# Patient Record
Sex: Male | Born: 1980 | Race: Black or African American | Hispanic: No | Smoking: Former smoker
Health system: Southern US, Community
[De-identification: ages and names within clinical notes are randomized; demographics above are authoritative.]

## PROBLEM LIST (undated history)

## (undated) DIAGNOSIS — D869 Sarcoidosis, unspecified: Secondary | ICD-10-CM

## (undated) HISTORY — PX: LYMPH NODE BIOPSY: SHX201

---

## 2004-06-06 ENCOUNTER — Encounter: Admission: RE | Admit: 2004-06-06 | Discharge: 2004-06-06 | Payer: Self-pay | Admitting: Family Medicine

## 2004-06-21 ENCOUNTER — Encounter (INDEPENDENT_AMBULATORY_CARE_PROVIDER_SITE_OTHER): Payer: Self-pay | Admitting: Specialist

## 2004-06-21 ENCOUNTER — Encounter (INDEPENDENT_AMBULATORY_CARE_PROVIDER_SITE_OTHER): Payer: Self-pay | Admitting: Surgery

## 2004-06-21 ENCOUNTER — Ambulatory Visit (HOSPITAL_BASED_OUTPATIENT_CLINIC_OR_DEPARTMENT_OTHER): Admission: RE | Admit: 2004-06-21 | Discharge: 2004-06-21 | Payer: Self-pay | Admitting: Surgery

## 2004-06-21 ENCOUNTER — Ambulatory Visit (HOSPITAL_COMMUNITY): Admission: RE | Admit: 2004-06-21 | Discharge: 2004-06-21 | Payer: Self-pay | Admitting: Surgery

## 2004-07-27 ENCOUNTER — Encounter: Admission: RE | Admit: 2004-07-27 | Discharge: 2004-07-27 | Payer: Self-pay | Admitting: Family Medicine

## 2004-07-31 ENCOUNTER — Ambulatory Visit: Payer: Self-pay | Admitting: Internal Medicine

## 2004-09-25 ENCOUNTER — Ambulatory Visit: Payer: Self-pay | Admitting: Internal Medicine

## 2004-10-11 ENCOUNTER — Ambulatory Visit: Payer: Self-pay | Admitting: Internal Medicine

## 2004-10-13 ENCOUNTER — Encounter: Admission: RE | Admit: 2004-10-13 | Discharge: 2004-10-13 | Payer: Self-pay | Admitting: Family Medicine

## 2004-10-30 ENCOUNTER — Ambulatory Visit: Payer: Self-pay | Admitting: Internal Medicine

## 2004-10-31 ENCOUNTER — Ambulatory Visit: Payer: Self-pay | Admitting: *Deleted

## 2004-11-07 ENCOUNTER — Ambulatory Visit: Payer: Self-pay | Admitting: Internal Medicine

## 2004-11-22 ENCOUNTER — Ambulatory Visit: Payer: Self-pay | Admitting: Internal Medicine

## 2005-01-05 ENCOUNTER — Ambulatory Visit: Payer: Self-pay | Admitting: Family Medicine

## 2005-01-29 ENCOUNTER — Ambulatory Visit: Payer: Self-pay | Admitting: Family Medicine

## 2009-05-20 ENCOUNTER — Emergency Department (HOSPITAL_COMMUNITY): Admission: EM | Admit: 2009-05-20 | Discharge: 2009-05-20 | Payer: Self-pay | Admitting: Emergency Medicine

## 2010-02-18 ENCOUNTER — Emergency Department (HOSPITAL_COMMUNITY): Admission: EM | Admit: 2010-02-18 | Discharge: 2010-02-18 | Payer: Self-pay | Admitting: Emergency Medicine

## 2010-06-11 ENCOUNTER — Encounter: Payer: Self-pay | Admitting: Family Medicine

## 2010-10-06 NOTE — Op Note (Signed)
NAME:  SKYELER, SMOLA NO.:  [id number]   MEDICAL RECORD NO.:  [id number]          PATIENT TYPE:  AMB   LOCATION:  [facility]                         FACILITY:  [facility]   PHYSICIAN:  Currie Paris, M.D.DATE OF BIRTH:  1981/02/21   DATE OF PROCEDURE:  06/21/2004  DATE OF DISCHARGE:                                 OPERATIVE REPORT   OFFICE MEDICAL RECORD NUMBER [id number].   PREOPERATIVE DIAGNOSIS:  Lymphadenopathy.   POSTOPERATIVE DIAGNOSIS:  Lymphadenopathy.   OPERATION:  Excisional biopsy right posterior cervical lymph node.   SURGEON:  Currie Paris, M.D.   ANESTHESIA:  MAC.   CLINICAL HISTORY:  This patient is a 30 year old who has presented with some  adenopathy.  Chest x-ray did also confirm some mediastinal adenopathy, and  we elected to do a biopsy of a cervical node, thinking that that would be  the easiest and likely to give [place] a diagnosis.   DESCRIPTION OF PROCEDURE:  The patient was seen in the holding area, and he  has no further questions.  The node in question was identified by the  patient and myself and marked.   The patient was taken to the operating room and given IV sedation.  The neck  was prepped and draped.  Time out was performed.   The patient's area over the node was anesthetized with 1% Xylocaine mixed  equally with 0.25% plain Marcaine.  A short incision was made and small  bleeders coagulated.  What appeared to be little platysmal fibers were split  and a lymph node identified, measuring 1.2 cm.  It was carefully dissected  out, staying very close to the lymph node using blunt dissection and a small  amount of cautery on a couple of small points that oozed.  I stayed close to  this to avoid injury to any nerves or other structures.   Once this was out, I did not really feel any other readily accessible nodes,  and I believed I had adequate tissue for pathology.   The incision was closed with a single 3-0 Vicryl to reapproximate  the  platysma and 4-0 Monocryl subcuticular for the Dermabond.   The patient tolerated the procedure well.  There were no operative  complications.  All counts were correct.      CJS/MEDQ  D:  06/21/2004  T:  06/21/2004  Job:  [id number]   cc:   Quita Skye. Artis Flock, M.D.  [address], [address]  [place]  [place] [address]  Fax: [phone]

## 2010-11-17 ENCOUNTER — Emergency Department (HOSPITAL_COMMUNITY)
Admission: EM | Admit: 2010-11-17 | Discharge: 2010-11-17 | Disposition: A | Discharge status: Home / Self Care | Payer: Self-pay | Attending: Emergency Medicine | Admitting: Emergency Medicine

## 2010-11-17 DIAGNOSIS — N390 Urinary tract infection, site not specified: Secondary | ICD-10-CM | POA: Insufficient documentation

## 2010-11-17 DIAGNOSIS — D869 Sarcoidosis, unspecified: Secondary | ICD-10-CM | POA: Insufficient documentation

## 2010-11-17 DIAGNOSIS — R319 Hematuria, unspecified: Secondary | ICD-10-CM | POA: Insufficient documentation

## 2010-11-17 LAB — URINALYSIS, ROUTINE W REFLEX MICROSCOPIC
Ketones, ur: NEGATIVE mg/dL
Leukocytes, UA: NEGATIVE
Nitrite: NEGATIVE
Protein, ur: NEGATIVE mg/dL
pH: 5.5 (ref 5.0–8.0)

## 2010-11-17 LAB — URINE MICROSCOPIC-ADD ON

## 2010-11-18 LAB — URINE CULTURE
Colony Count: NO GROWTH
Culture: NO GROWTH

## 2011-04-03 ENCOUNTER — Emergency Department (HOSPITAL_COMMUNITY)
Admission: EM | Admit: 2011-04-03 | Discharge: 2011-04-03 | Disposition: A | Discharge status: Home / Self Care | Payer: PRIVATE HEALTH INSURANCE | Attending: Emergency Medicine | Admitting: Emergency Medicine

## 2011-04-03 ENCOUNTER — Encounter: Payer: Self-pay | Admitting: *Deleted

## 2011-04-03 DIAGNOSIS — R110 Nausea: Secondary | ICD-10-CM | POA: Insufficient documentation

## 2011-04-03 DIAGNOSIS — J329 Chronic sinusitis, unspecified: Secondary | ICD-10-CM | POA: Insufficient documentation

## 2011-04-03 DIAGNOSIS — J3489 Other specified disorders of nose and nasal sinuses: Secondary | ICD-10-CM | POA: Insufficient documentation

## 2011-04-03 DIAGNOSIS — R51 Headache: Secondary | ICD-10-CM | POA: Insufficient documentation

## 2011-04-03 HISTORY — DX: Sarcoidosis, unspecified: D86.9

## 2011-04-03 MED ORDER — AZITHROMYCIN 250 MG PO TABS: 250.0000 mg | ORAL_TABLET | Freq: Every day | ORAL | Status: AC

## 2011-04-03 MED ORDER — KETOROLAC TROMETHAMINE 60 MG/2ML IM SOLN
60.0000 mg | Freq: Once | INTRAMUSCULAR | Status: AC
  Administered 2011-04-03: 60 mg via INTRAMUSCULAR
  Filled 2011-04-03: qty 2

## 2011-04-03 MED ORDER — ONDANSETRON 4 MG PO TBDP
8.0000 mg | ORAL_TABLET | Freq: Once | ORAL | Status: AC
  Administered 2011-04-03: 8 mg via ORAL
  Filled 2011-04-03: qty 2

## 2011-04-03 MED ORDER — PROMETHAZINE HCL 25 MG PO TABS: 25.0000 mg | ORAL_TABLET | Freq: Four times a day (QID) | ORAL | Status: AC | PRN

## 2011-04-03 NOTE — ED Provider Notes (Signed)
Medical screening examination/treatment/procedure(s) were performed by non-physician practitioner and as supervising physician I was immediately available for consultation/collaboration  Harrold Donath R. Rubin Payor, MD 04/03/11 1851

## 2011-04-03 NOTE — ED Notes (Signed)
PT COMPLAINS OF NASAL STUFFINESS AND ONSET HEADACHE ACROSS HIS BROW THIS MORNING WHILE AT WORK. STATES HE VOMITED 1 TIME AFTER ONSET HEADACHE. PERL. STATES HAS HX OF SINUS INFECTION 2 MONTHS AGO THAT WAS TREATED WITH ZPACK

## 2011-04-03 NOTE — ED Provider Notes (Signed)
History     CSN: [id number] Arrival date & time: 04/03/2011 12:57 PM   First MD Initiated Contact with Patient 04/03/11 1327      Chief Complaint  Patient presents with  . Sinusitis   Patient is a 30 y.o. male presenting with sinusitis and headaches.  Sinusitis  Associated symptoms include congestion and sinus pressure. Pertinent negatives include no sweats, no hoarse voice, no swollen glands, no cough and no shortness of breath.  Headache  The pain is located in the frontal region. Associated symptoms include nausea. Pertinent negatives include no anorexia, no fever, no malaise/fatigue, no near-syncope, no orthopnea, no shortness of breath and no vomiting.   Patient seen and evaluated at 2:00 pm. Patient reports that he has had some nasal congestion and sinus pressure is still a left maxillary sinus. Patient reports also having associated headache started this afternoon was gradual in onset. Patient denies any fevers. Reports that he has recently been treated for a sinus infection. Was started on Z-Pak. Reports that this feels alliances previous episode of sinusitis. Denies any neuro focal deficits. Denies any double vision. Denies any problems swallowing or walking. Denies any difficulty talking. Denies any numbness or weakness. Denies that this is the worst headache of his life.  Past Medical History  Diagnosis Date  . Sarcoidosis     History reviewed. No pertinent past surgical history.  History reviewed. No pertinent family history.  History  Substance Use Topics  . Smoking status: Not on file  . Smokeless tobacco: Not on file  . Alcohol Use: No      Review of Systems  Constitutional: Negative for fever and malaise/fatigue.  HENT: Positive for congestion and sinus pressure. Negative for hoarse voice.   Respiratory: Negative for cough and shortness of breath.   Cardiovascular: Negative for orthopnea and near-syncope.  Gastrointestinal: Positive for nausea. Negative for  vomiting and anorexia.  Neurological: Positive for headaches.    Allergies  Review of patient's allergies indicates no known allergies.  Home Medications  No current outpatient prescriptions on file.  BP 125/109  Pulse 65  Temp(Src) 97.8 F (36.6 C) (Oral)  Resp 16  SpO2 97%  Physical Exam  Nursing note and vitals reviewed. Constitutional: He is oriented to person, place, and time. He appears well-developed and well-nourished.  HENT:  Head: Normocephalic and atraumatic.  Right Ear: External ear normal.  Left Ear: External ear normal.  Nose: Right sinus exhibits frontal sinus tenderness. Left sinus exhibits maxillary sinus tenderness and frontal sinus tenderness.  Mouth/Throat: Oropharynx is clear and moist. No oropharyngeal exudate.  Eyes: Pupils are equal, round, and reactive to light. Right eye exhibits no nystagmus. Left eye exhibits no nystagmus.    Neck: Normal range of motion. Neck supple. No rigidity. No Brudzinski's sign and no Kernig's sign noted.  Cardiovascular: Normal rate and regular rhythm.  Exam reveals no gallop and no friction rub.   No murmur heard. Pulmonary/Chest: Effort normal and breath sounds normal. No respiratory distress. He has no wheezes.  Musculoskeletal: Normal range of motion. He exhibits no tenderness.  Neurological: He is alert and oriented to person, place, and time. He has normal reflexes. He displays normal reflexes. No cranial nerve deficit or sensory deficit. He exhibits normal muscle tone. Coordination and gait normal. GCS eye subscore is 4. GCS verbal subscore is 5. GCS motor subscore is 6.       Normal gait. rhomberg negative.     ED Course  Procedures (including critical care time)  Patient seen and evaluated.  VSS reviewed. Nursing notes reviewed no neurofocal deficits, no imaging needed at this time. Will monitor the patient closely. They agree with the treatment plan and diagnosis.   2:26 PM Patient seen and re-evaluated. Resting  comfortably. VSS stable. NAD. Patient notified of testing results. Stated agreement and understanding. Patient stated understanding to treatment plan and diagnosis. Discussed with patient warning signs to return. Advised patient extensively on treatment plan. Pain medication given in the ED.     MDM  Sinusitis Headache         Demetrius Charity, PA 04/03/11 1427  Demetrius Charity, PA 04/03/11 1429  Demetrius Charity, PA 04/03/11 1429

## 2011-04-03 NOTE — ED Notes (Signed)
To ed for eval after having some blurred vision associated with a HA. States he is having pain under eyes. States he is blowing his nose and has recently been on z-pack for sinus infection. Appears in nad

## 2011-05-14 ENCOUNTER — Encounter (HOSPITAL_COMMUNITY): Payer: Self-pay

## 2011-05-14 ENCOUNTER — Emergency Department (HOSPITAL_COMMUNITY)
Admission: EM | Admit: 2011-05-14 | Discharge: 2011-05-14 | Disposition: A | Discharge status: Home / Self Care | Payer: PRIVATE HEALTH INSURANCE | Attending: Emergency Medicine | Admitting: Emergency Medicine

## 2011-05-14 DIAGNOSIS — K029 Dental caries, unspecified: Secondary | ICD-10-CM | POA: Insufficient documentation

## 2011-05-14 DIAGNOSIS — R6883 Chills (without fever): Secondary | ICD-10-CM | POA: Insufficient documentation

## 2011-05-14 DIAGNOSIS — R51 Headache: Secondary | ICD-10-CM | POA: Insufficient documentation

## 2011-05-14 DIAGNOSIS — J329 Chronic sinusitis, unspecified: Secondary | ICD-10-CM | POA: Insufficient documentation

## 2011-05-14 MED ORDER — SODIUM CHLORIDE 0.65 % NA SOLN: 1.0000 | NASAL | Status: DC | PRN

## 2011-05-14 MED ORDER — PSEUDOEPHEDRINE HCL 30 MG PO TABS: 30.0000 mg | ORAL_TABLET | ORAL | Status: AC | PRN

## 2011-05-14 NOTE — ED Provider Notes (Signed)
Medical screening examination/treatment/procedure(s) were performed by non-physician practitioner and as supervising physician I was immediately available for consultation/collaboration.   Dayton Bailiff, MD 05/14/11 [phone]

## 2011-05-14 NOTE — ED Provider Notes (Signed)
History     CSN: [id number]  Arrival date & time 05/14/11  1546   First MD Initiated Contact with Patient 05/14/11 1604      Chief Complaint  Patient presents with  . Facial Pain  . Nasal Congestion    x 2 wks    (Consider location/radiation/quality/duration/timing/severity/associated sxs/prior treatment) Patient is a 30 y.o. male presenting with sinusitis. The history is provided by the patient.  Sinusitis  This is a new problem. There has been no fever. The fever has been present for less than 1 day. The pain is at a severity of 3/10. The pain has been constant since onset. Associated symptoms include chills, sweats, congestion and sinus pressure. Pertinent negatives include no ear pain, no sore throat, no cough and no shortness of breath.  Pt repots sinus pressure, nasal congestion for the last several days. Already taking amoxicillin for dental pain. States symptoms not improving. States hx of sinusitis. Denies fever, chills, sore throat, cough, nausea, vomiting.  Past Medical History  Diagnosis Date  . Sarcoidosis     Past Surgical History  Procedure Date  . Lymph node biopsy     No family history on file.  History  Substance Use Topics  . Smoking status: Current Everyday Smoker -- 0.5 packs/day for 2 years    Types: Cigarettes  . Smokeless tobacco: Not on file  . Alcohol Use: Yes     occasionally      Review of Systems  Constitutional: Positive for chills. Negative for fever.  HENT: Positive for congestion, rhinorrhea and sinus pressure. Negative for ear pain, sore throat, neck pain and ear discharge.   Eyes: Negative.   Respiratory: Negative for cough and shortness of breath.   Cardiovascular: Negative.   Gastrointestinal: Negative.   Genitourinary: Negative.   Musculoskeletal: Negative.   Neurological: Negative.   Psychiatric/Behavioral: Negative.     Allergies  Review of patient's allergies indicates no known allergies.  Home Medications    Current Outpatient Rx  Name Route Sig Dispense Refill  . AMOXICILLIN 500 MG PO CAPS Oral Take 500 mg by mouth 4 (four) times daily. PT TO TAKE FOR 7 DAYS PTS ON DAY 5 OF THERAPY      BP 106/52  Pulse 71  Temp(Src) 98.8 F (37.1 C) (Oral)  Resp 18  SpO2 100%  Physical Exam  Nursing note and vitals reviewed. Constitutional: He is oriented to person, place, and time. He appears well-developed and well-nourished. No distress.  HENT:  Head: Normocephalic.  Right Ear: Tympanic membrane, external ear and ear canal normal.  Left Ear: Tympanic membrane, external ear and ear canal normal.  Nose: Rhinorrhea present.  Mouth/Throat: Uvula is midline, oropharynx is clear and moist and mucous membranes are normal. Dental caries present. No dental abscesses or uvula swelling.  Eyes: Conjunctivae are normal.  Neck: Neck supple.  Cardiovascular: Normal rate, regular rhythm and normal heart sounds.   Pulmonary/Chest: Effort normal and breath sounds normal.  Abdominal: Soft. Bowel sounds are normal. There is no tenderness.  Musculoskeletal: Normal range of motion.  Lymphadenopathy:    He has no cervical adenopathy.  Neurological: He is alert and oriented to person, place, and time.  Skin: Skin is warm and dry. No rash noted.  Psychiatric: He has a normal mood and affect.    ED Course  Procedures (including critical care time)  Pt with sinus pressure, nasal congestion. No fever, vs normal. Pt already taking amoxicillin. Suspect viral sinusitis since no improvement on antibiotics.  Will start on sudafed, nasal saline, follow up.   MDM          Lottie Mussel, PA 05/14/11 1650

## 2011-07-04 ENCOUNTER — Encounter (HOSPITAL_COMMUNITY): Payer: Self-pay | Admitting: *Deleted

## 2011-07-04 ENCOUNTER — Emergency Department (HOSPITAL_COMMUNITY)
Admission: EM | Admit: 2011-07-04 | Discharge: 2011-07-04 | Disposition: A | Discharge status: Home / Self Care | Payer: BC Managed Care – PPO | Source: Home / Self Care | Attending: Family Medicine | Admitting: Family Medicine

## 2011-07-04 DIAGNOSIS — J019 Acute sinusitis, unspecified: Secondary | ICD-10-CM

## 2011-07-04 MED ORDER — DOXYCYCLINE HYCLATE 100 MG PO CAPS: 100.0000 mg | ORAL_CAPSULE | Freq: Two times a day (BID) | ORAL | Status: AC

## 2011-07-04 MED ORDER — IPRATROPIUM BROMIDE 0.06 % NA SOLN: 2.0000 | Freq: Four times a day (QID) | NASAL | Status: DC

## 2011-07-04 MED ORDER — GUAIFENESIN ER 600 MG PO TB12: 1200.0000 mg | ORAL_TABLET | Freq: Two times a day (BID) | ORAL | Status: DC

## 2011-07-04 MED ORDER — ONDANSETRON HCL 4 MG PO TABS: 4.0000 mg | ORAL_TABLET | Freq: Four times a day (QID) | ORAL | Status: AC

## 2011-07-04 NOTE — ED Notes (Signed)
Pt with c/o sinus congestion x 2 weeks - vomited x one today

## 2011-07-04 NOTE — ED Provider Notes (Signed)
History     CSN: [id number]  Arrival date & time 07/04/11  1452   First MD Initiated Contact with Patient 07/04/11 1608      Chief Complaint  Patient presents with  . Nasal Congestion    (Consider location/radiation/quality/duration/timing/severity/associated sxs/prior treatment) Patient is a 31 y.o. male presenting with URI. The history is provided by the patient.  URI The primary symptoms include fever, sore throat, cough and vomiting. Primary symptoms do not include wheezing. The current episode started more than 1 week ago. This is a new problem. The problem has been gradually worsening.  Symptoms associated with the illness include congestion and rhinorrhea. Associated symptoms comments: Green mucous for 2 weeks..    Past Medical History  Diagnosis Date  . Sarcoidosis     Past Surgical History  Procedure Date  . Lymph node biopsy     History reviewed. No pertinent family history.  History  Substance Use Topics  . Smoking status: Current Everyday Smoker -- 0.5 packs/day for 2 years    Types: Cigarettes  . Smokeless tobacco: Not on file  . Alcohol Use: Yes     occasionally      Review of Systems  Constitutional: Positive for fever.  HENT: Positive for congestion, sore throat, rhinorrhea and postnasal drip.   Respiratory: Positive for cough. Negative for wheezing.   Gastrointestinal: Positive for vomiting.  Genitourinary: Negative.   Musculoskeletal: Negative.     Allergies  Review of patient's allergies indicates no known allergies.  Home Medications   Current Outpatient Rx  Name Route Sig Dispense Refill  . IBUPROFEN 400 MG PO TABS Oral Take 400 mg by mouth every 6 (six) hours as needed.    . AMOXICILLIN 500 MG PO CAPS Oral Take 500 mg by mouth 4 (four) times daily. PT TO TAKE FOR 7 DAYS PTS ON DAY 5 OF THERAPY    . DOXYCYCLINE HYCLATE 100 MG PO CAPS Oral Take 1 capsule (100 mg total) by mouth 2 (two) times daily. 28 capsule 0  . GUAIFENESIN ER 600  MG PO TB12 Oral Take 2 tablets (1,200 mg total) by mouth 2 (two) times daily. 30 tablet 1  . IPRATROPIUM BROMIDE 0.06 % NA SOLN Nasal Place 2 sprays into the nose 4 (four) times daily. 15 mL 1  . ONDANSETRON HCL 4 MG PO TABS Oral Take 1 tablet (4 mg total) by mouth every 6 (six) hours. 8 tablet 0  . SODIUM CHLORIDE 0.65 % NA SOLN Nasal Place 1 spray into the nose as needed for congestion. 15 mL 12    BP 120/79  Pulse 68  Temp(Src) 98.5 F (36.9 C) (Oral)  Resp 16  SpO2 100%  Physical Exam  Nursing note and vitals reviewed. Constitutional: He is oriented to person, place, and time. He appears well-developed and well-nourished.  HENT:  Head: Normocephalic.  Right Ear: External ear normal.  Left Ear: External ear normal.  Nose: Nose normal.  Mouth/Throat: Oropharynx is clear and moist.  Eyes: Pupils are equal, round, and reactive to light.  Neck: Normal range of motion. Neck supple.  Cardiovascular: Normal rate, regular rhythm, normal heart sounds and intact distal pulses.   Pulmonary/Chest: Effort normal and breath sounds normal.  Lymphadenopathy:    He has no cervical adenopathy.  Neurological: He is alert and oriented to person, place, and time.  Skin: Skin is warm and dry.    ED Course  Procedures (including critical care time)  Labs Reviewed - No data to display  No results found.   1. Sinusitis, acute       MDM          Barkley Bruns, MD 07/04/11 [phone]

## 2011-07-04 NOTE — Discharge Instructions (Signed)
Drink plenty of fluids as discussed, use medicine as prescribed,  Return or see your doctor if further problems °

## 2011-07-27 ENCOUNTER — Other Ambulatory Visit: Payer: Self-pay | Admitting: Rheumatology

## 2011-07-27 ENCOUNTER — Ambulatory Visit
Admission: RE | Admit: 2011-07-27 | Discharge: 2011-07-27 | Disposition: A | Discharge status: Home / Self Care | Payer: BC Managed Care – PPO | Source: Ambulatory Visit | Attending: Rheumatology | Admitting: Rheumatology

## 2011-07-27 DIAGNOSIS — D869 Sarcoidosis, unspecified: Secondary | ICD-10-CM

## 2011-08-07 ENCOUNTER — Emergency Department (INDEPENDENT_AMBULATORY_CARE_PROVIDER_SITE_OTHER)
Admission: EM | Admit: 2011-08-07 | Discharge: 2011-08-07 | Disposition: A | Discharge status: Home / Self Care | Payer: BC Managed Care – PPO | Source: Home / Self Care | Attending: Family Medicine | Admitting: Family Medicine

## 2011-08-07 ENCOUNTER — Encounter (HOSPITAL_COMMUNITY): Payer: Self-pay | Admitting: Emergency Medicine

## 2011-08-07 DIAGNOSIS — A5900 Urogenital trichomoniasis, unspecified: Secondary | ICD-10-CM

## 2011-08-07 MED ORDER — METRONIDAZOLE 500 MG PO TABS: 500.0000 mg | ORAL_TABLET | Freq: Two times a day (BID) | ORAL | Status: AC

## 2011-08-07 NOTE — Discharge Instructions (Signed)
Trichomoniasis  Trichomoniasis is an infection, caused by the Trichomonas organism, that affects both women and men. In women, the outer male genitalia and the vagina are affected. In men, the penis is mainly affected, but the prostate and other reproductive organs can also be involved. Trichomoniasis is a sexually transmitted disease (STD) and is most often passed to another person through sexual contact. The majority of people who get trichomoniasis do so from a sexual encounter and are also at risk for other STDs.  CAUSES    Sexual intercourse with an infected partner.   It can be present in swimming pools or hot tubs.  SYMPTOMS    Abnormal gray-green frothy vaginal discharge in women.   Vaginal itching and irritation in women.   Itching and irritation of the area outside the vagina in women.   Penile discharge with or without pain in males.   Inflammation of the urethra (urethritis), causing painful urination.   Bleeding after sexual intercourse.  RELATED COMPLICATIONS   Pelvic inflammatory disease.   Infection of the uterus (endometritis).   Infertility.   Tubal (ectopic) pregnancy.   It can be associated with other STDs, including gonorrhea and chlamydia, hepatitis B, and HIV.  COMPLICATIONS DURING PREGNANCY   Early (premature) delivery.   Premature rupture of the membranes (PROM).   Low birth weight.  DIAGNOSIS    Visualization of Trichomonas under the microscope from the vagina discharge.   Ph of the vagina greater than 4.5, tested with a test tape.   Trich Rapid Test.   Culture of the organism, but this is not usually needed.   It may be found on a Pap test.   Having a "strawberry cervix,"which means the cervix looks very red like a strawberry.  TREATMENT    You may be given medication to fight the infection. Inform your caregiver if you could be or are pregnant. Some medications used to treat the infection should not be taken during pregnancy.   Over-the-counter medications or  creams to decrease itching or irritation may be recommended.   Your sexual partner will need to be treated if infected.  HOME CARE INSTRUCTIONS    Take all medication prescribed by your caregiver.   Take over-the-counter medication for itching or irritation as directed by your caregiver.   Do not have sexual intercourse while you have the infection.   Do not douche or wear tampons.   Discuss your infection with your partner, as your partner may have acquired the infection from you. Or, your partner may have been the person who transmitted the infection to you.   Have your sex partner examined and treated if necessary.   Practice safe, informed, and protected sex.   See your caregiver for other STD testing.  SEEK MEDICAL CARE IF:    You still have symptoms after you finish the medication.   You have an oral temperature above 102 F (38.9 C).   You develop belly (abdominal) pain.   You have pain when you urinate.   You have bleeding after sexual intercourse.   You develop a rash.   The medication makes you sick or makes you throw up (vomit).  Document Released: 10/31/2000 Document Revised: 04/26/2011 Document Reviewed: 11/26/2008  [employer] Patient Information 2012 [employer].

## 2011-08-07 NOTE — ED Provider Notes (Signed)
History     CSN: [id number]  Arrival date & time 08/07/11  1713   First MD Initiated Contact with Patient 08/07/11 1721      Chief Complaint  Patient presents with  . SEXUALLY TRANSMITTED DISEASE    (Consider location/radiation/quality/duration/timing/severity/associated sxs/prior treatment) HPI Comments: 31 year old male with history of sarcoidosis. Here concerned as his sexual partner was recently diagnosed with trichomonas and has a notification card. He states she has not had a GYN test or Pap smear in the last 3 years. None of them have had significant symptoms. Patient states he had blood in the urine about 5 months ago and his urine test did not show trichomonas.  He wonders about timing of infection and it is possible that he could have had trichomonas in the last 3 years. He reports being monogamous. Patient is  not interested in being checked for other STDs.  His girlfriend was negative for other STDs. He wants to be treated for trichomonas.   Past Medical History  Diagnosis Date  . Sarcoidosis     Past Surgical History  Procedure Date  . Lymph node biopsy     History reviewed. No pertinent family history.  History  Substance Use Topics  . Smoking status: Current Everyday Smoker -- 0.5 packs/day for 2 years    Types: Cigarettes  . Smokeless tobacco: Not on file  . Alcohol Use: 0.6 oz/week    1 Cans of beer per week     occasionally      Review of Systems  Genitourinary: Negative for dysuria, hematuria, flank pain, discharge, scrotal swelling, genital sores and testicular pain.  All other systems reviewed and are negative.    Allergies  Review of patient's allergies indicates no known allergies.  Home Medications   Current Outpatient Rx  Name Route Sig Dispense Refill  . HYDROXYCHLOROQUINE SULFATE 200 MG PO TABS Oral Take 600 mg by mouth daily.    Marland Kitchen PREDNISONE 10 MG PO TABS Oral Take 10 mg by mouth 3 (three) times daily.    . AMOXICILLIN 500 MG PO  CAPS Oral Take 500 mg by mouth 4 (four) times daily. PT TO TAKE FOR 7 DAYS PTS ON DAY 5 OF THERAPY    . GUAIFENESIN ER 600 MG PO TB12 Oral Take 2 tablets (1,200 mg total) by mouth 2 (two) times daily. 30 tablet 1  . IBUPROFEN 400 MG PO TABS Oral Take 400 mg by mouth every 6 (six) hours as needed.    . IPRATROPIUM BROMIDE 0.06 % NA SOLN Nasal Place 2 sprays into the nose 4 (four) times daily. 15 mL 1  . METRONIDAZOLE 500 MG PO TABS Oral Take 1 tablet (500 mg total) by mouth 2 (two) times daily. 14 tablet 0  . SODIUM CHLORIDE 0.65 % NA SOLN Nasal Place 1 spray into the nose as needed for congestion. 15 mL 12    BP 129/79  Pulse 61  Temp(Src) 98.4 F (36.9 C) (Oral)  Resp 16  Ht 6\' 1"  (1.854 m)  Wt 215 lb (97.523 kg)  BMI 28.37 kg/m2  SpO2 100%  Physical Exam  Nursing note and vitals reviewed. Constitutional: He is oriented to person, place, and time. He appears well-developed and well-nourished. No distress.  Cardiovascular: Normal heart sounds.   Pulmonary/Chest: Breath sounds normal.  Abdominal: Soft. There is no tenderness.       No CVT  Neurological: He is alert and oriented to person, place, and time.    ED Course  Procedures (including critical care time)  Labs Reviewed - No data to display No results found.   1. Trichomoniasis, urogenital       MDM  Explained it is possible the couple could of been infected with trichomonas for a long time without significant symptoms. Prescribed metronidazole 500 mg twice a day for 7 days. Patient aware that inconsistent condom use become prevent sexually transmitted diseases. The client declined STD testing today.        Sharin Grave, MD 08/10/11 [phone]

## 2011-08-07 NOTE — ED Notes (Signed)
Pt came in with a card notifying him that his sexual partner has been diagnosed with trichomoniasis. Pt stated that he had blood in the urine 02/2011 and was seen by a physician, but wasn't diagnosed with anything. Pt currently denies discharge and dysfunction with urination.

## 2012-02-16 ENCOUNTER — Emergency Department (HOSPITAL_COMMUNITY)
Admission: EM | Admit: 2012-02-16 | Discharge: 2012-02-16 | Disposition: A | Discharge status: Home / Self Care | Payer: BC Managed Care – PPO | Source: Home / Self Care | Attending: Family Medicine | Admitting: Family Medicine

## 2012-02-16 ENCOUNTER — Encounter (HOSPITAL_COMMUNITY): Payer: Self-pay

## 2012-02-16 DIAGNOSIS — J069 Acute upper respiratory infection, unspecified: Secondary | ICD-10-CM

## 2012-02-16 DIAGNOSIS — R05 Cough: Secondary | ICD-10-CM

## 2012-02-16 LAB — POCT RAPID STREP A: Streptococcus, Group A Screen (Direct): NEGATIVE

## 2012-02-16 MED ORDER — AZITHROMYCIN 250 MG PO TABS: ORAL_TABLET | ORAL | Status: DC

## 2012-02-16 MED ORDER — ALBUTEROL SULFATE HFA 108 (90 BASE) MCG/ACT IN AERS: 2.0000 | INHALATION_SPRAY | RESPIRATORY_TRACT | Status: DC | PRN

## 2012-02-16 MED ORDER — GUAIFENESIN-CODEINE 100-10 MG/5ML PO SYRP: 5.0000 mL | ORAL_SOLUTION | Freq: Three times a day (TID) | ORAL | Status: DC | PRN

## 2012-02-16 MED ORDER — SODIUM CHLORIDE 0.65 % NA SOLN: 1.0000 | NASAL | Status: DC | PRN

## 2012-02-16 MED ORDER — CETIRIZINE-PSEUDOEPHEDRINE ER 5-120 MG PO TB12: 1.0000 | ORAL_TABLET | Freq: Two times a day (BID) | ORAL | Status: DC

## 2012-02-16 NOTE — ED Notes (Signed)
Cough for 1 week, reports yellow nasal secretions; chest clear to ascultation

## 2012-02-16 NOTE — ED Provider Notes (Signed)
Medical screening examination/treatment/procedure(s) were performed by resident physician or non-physician practitioner and as supervising physician I was immediately available for consultation/collaboration.   Wilkins Elpers DOUGLAS MD.    Jveon Pound D Ladonne Sharples, MD 02/16/12 1527 

## 2012-02-16 NOTE — ED Provider Notes (Signed)
History     CSN: [id number]  Arrival date & time 02/16/12  5284   None     Chief Complaint  Patient presents with  . Cough    (Consider location/radiation/quality/duration/timing/severity/associated sxs/prior treatment) Patient is a 31 y.o. male presenting with cough. The history is provided by the patient.  Cough This is a new problem.  Nathan Flores is a 31 y.o. male who complains of onset of head cold for 1 week.  Pt took left over amoxicillin for symptoms with no relief, additionally took sinus relief tabs with no relief in symptoms.   + sore throat + cough, non productive + pleuritic pain No wheezing + nasal congestion + post-nasal drainage + sinus pain/pressure + voice changes No chest congestion No itchy/red eyes No earache No hemoptysis No SOB + chills/sweats No fever No nausea No vomiting No abdominal pain No diarrhea No skin rashes No fatigue No myalgias No headache  No ill contacts   Past Medical History  Diagnosis Date  . Sarcoidosis     Past Surgical History  Procedure Date  . Lymph node biopsy     History reviewed. No pertinent family history.  History  Substance Use Topics  . Smoking status: Current Every Day Smoker -- 0.5 packs/day for 2 years    Types: Cigarettes  . Smokeless tobacco: Not on file  . Alcohol Use: 0.6 oz/week    1 Cans of beer per week     occasionally      Review of Systems  Respiratory: Positive for cough.   All other systems reviewed and are negative.    Allergies  Review of patient's allergies indicates no known allergies.  Home Medications   Current Outpatient Rx  Name Route Sig Dispense Refill  . HYDROXYCHLOROQUINE SULFATE 200 MG PO TABS Oral Take 600 mg by mouth daily.    . IBUPROFEN 400 MG PO TABS Oral Take 400 mg by mouth every 6 (six) hours as needed.    . ALBUTEROL SULFATE HFA 108 (90 BASE) MCG/ACT IN AERS Inhalation Inhale 2 puffs into the lungs every 4 (four) hours as needed for wheezing. 1  Inhaler 0  . AMOXICILLIN 500 MG PO CAPS Oral Take 500 mg by mouth 4 (four) times daily. PT TO TAKE FOR 7 DAYS PTS ON DAY 5 OF THERAPY    . AZITHROMYCIN 250 MG PO TABS  Azithromycin 500mg  on day 1, then 250mg  on days 2-4 6 tablet 0  . CETIRIZINE-PSEUDOEPHEDRINE ER 5-120 MG PO TB12 Oral Take 1 tablet by mouth 2 (two) times daily. 60 tablet 0  . GUAIFENESIN-CODEINE 100-10 MG/5ML PO SYRP Oral Take 5 mLs by mouth 3 (three) times daily as needed for cough. 120 mL 0  . IPRATROPIUM BROMIDE 0.06 % NA SOLN Nasal Place 2 sprays into the nose 4 (four) times daily. 15 mL 1  . PREDNISONE 10 MG PO TABS Oral Take 10 mg by mouth 3 (three) times daily.    . SODIUM CHLORIDE 0.65 % NA SOLN Nasal Place 1 spray into the nose as needed for congestion. 15 mL 12    BP 118/51  Pulse 67  Temp 98.1 F (36.7 C) (Oral)  Resp 18  SpO2 96%  Physical Exam  Nursing note and vitals reviewed. Constitutional: He is oriented to person, place, and time. Vital signs are normal. He appears well-developed and well-nourished. He is active and cooperative.  HENT:  Head: Normocephalic.  Right Ear: Hearing, tympanic membrane, external ear and ear canal normal.  Left  Ear: Hearing, tympanic membrane, external ear and ear canal normal.  Nose: Rhinorrhea present. Right sinus exhibits maxillary sinus tenderness. Right sinus exhibits no frontal sinus tenderness. Left sinus exhibits maxillary sinus tenderness. Left sinus exhibits no frontal sinus tenderness.  Mouth/Throat: Uvula is midline and mucous membranes are normal. Posterior oropharyngeal erythema present. No oropharyngeal exudate or posterior oropharyngeal edema.  Eyes: Conjunctivae normal are normal. Pupils are equal, round, and reactive to light. No scleral icterus.  Neck: Trachea normal. Neck supple. No spinous process tenderness and no muscular tenderness present.  Cardiovascular: Normal rate, regular rhythm, S1 normal, normal heart sounds, intact distal pulses and normal  pulses.   Pulmonary/Chest: Effort normal and breath sounds normal.  Lymphadenopathy:       Head (right side): No submental, no submandibular, no tonsillar, no preauricular, no posterior auricular and no occipital adenopathy present.       Head (left side): No submental, no submandibular, no tonsillar, no preauricular, no posterior auricular and no occipital adenopathy present.    He has no cervical adenopathy.  Neurological: He is alert and oriented to person, place, and time. No cranial nerve deficit or sensory deficit.  Skin: Skin is warm and dry.  Psychiatric: He has a normal mood and affect. His speech is normal and behavior is normal. Judgment and thought content normal. Cognition and memory are normal.    ED Course  Procedures (including critical care time)   Labs Reviewed  POCT RAPID STREP A ([facility] ONLY)   No results found.   1. Cough   2. URI (upper respiratory infection)       MDM  Increase fluid intake, rest.  Begin azithromycin if symptoms are not improved in 3-5 days.  Begin saline nasal spray and/or saline irrigation, and cough suppressant at bedtime. Antihistamines of your choice (Claritin or Zyrtec).  Tylenol or Motrin for fever/discomfort.  Followup with PCP if not improving 7 to 10 days.        Johnsie Kindred, NP 02/16/12 1037

## 2012-04-22 ENCOUNTER — Encounter (HOSPITAL_COMMUNITY): Payer: Self-pay | Admitting: Emergency Medicine

## 2012-04-22 ENCOUNTER — Emergency Department (INDEPENDENT_AMBULATORY_CARE_PROVIDER_SITE_OTHER)
Admission: EM | Admit: 2012-04-22 | Discharge: 2012-04-22 | Disposition: A | Discharge status: Home / Self Care | Payer: BC Managed Care – PPO | Source: Home / Self Care | Attending: Emergency Medicine | Admitting: Emergency Medicine

## 2012-04-22 ENCOUNTER — Emergency Department (HOSPITAL_COMMUNITY)
Admit: 2012-04-22 | Discharge: 2012-04-22 | Disposition: A | Discharge status: Home / Self Care | Payer: BC Managed Care – PPO | Attending: Emergency Medicine | Admitting: Emergency Medicine

## 2012-04-22 DIAGNOSIS — R109 Unspecified abdominal pain: Secondary | ICD-10-CM

## 2012-04-22 DIAGNOSIS — R197 Diarrhea, unspecified: Secondary | ICD-10-CM

## 2012-04-22 LAB — POCT URINALYSIS DIP (DEVICE)
Bilirubin Urine: NEGATIVE
Glucose, UA: NEGATIVE mg/dL
Nitrite: NEGATIVE
Specific Gravity, Urine: 1.025 (ref 1.005–1.030)
Urobilinogen, UA: 0.2 mg/dL (ref 0.0–1.0)

## 2012-04-22 LAB — POCT H PYLORI SCREEN: H. PYLORI SCREEN, POC: NEGATIVE

## 2012-04-22 NOTE — ED Notes (Signed)
Instructed to put on gown for physician examination 

## 2012-04-22 NOTE — ED Notes (Signed)
Discharge is pending urine results

## 2012-04-22 NOTE — ED Provider Notes (Signed)
Medical screening examination/treatment/procedure(s) were performed by non-physician practitioner and as supervising physician I was immediately available for consultation/collaboration.   Raynald Blend, MD 04/22/12 1556

## 2012-04-22 NOTE — ED Provider Notes (Signed)
History     CSN: [id number]  Arrival date & time 04/22/12  1306   First MD Initiated Contact with Patient 04/22/12 1347      Chief Complaint  Patient presents with  . Abdominal Pain    (Consider location/radiation/quality/duration/timing/severity/associated sxs/prior treatment) Patient is a 31 y.o. male presenting with abdominal pain. The history is provided by the patient.  Abdominal Pain The primary symptoms of the illness include abdominal pain and nausea. The current episode started more than 2 days ago (1 week). The onset of the illness was sudden. The problem has not changed since onset. The pain came on gradually. The abdominal pain is generalized. The abdominal pain does not radiate. The severity of the abdominal pain is 4/10. The abdominal pain is relieved by passing flatus, bowel movement and vomiting. The abdominal pain is exacerbated by eating.  The nausea is exacerbated by food.  The patient has had a change in bowel habit. Additional symptoms associated with the illness include constipation. Symptoms associated with the illness do not include anorexia, heartburn, urgency, hematuria, frequency or back pain. Significant associated medical issues do not include PUD, GERD or diverticulitis.    Past Medical History  Diagnosis Date  . Sarcoidosis     Past Surgical History  Procedure Date  . Lymph node biopsy     No family history on file.  History  Substance Use Topics  . Smoking status: Current Every Day Smoker -- 0.5 packs/day for 2 years    Types: Cigarettes  . Smokeless tobacco: Not on file  . Alcohol Use: 0.6 oz/week    1 Cans of beer per week     Comment: occasionally      Review of Systems  Gastrointestinal: Positive for nausea, abdominal pain and constipation. Negative for heartburn and anorexia.  Genitourinary: Negative for urgency, frequency and hematuria.  Musculoskeletal: Negative for back pain.  All other systems reviewed and are  negative.    Allergies  Review of patient's allergies indicates no known allergies.  Home Medications   Current Outpatient Rx  Name  Route  Sig  Dispense  Refill  . HYDROXYCHLOROQUINE SULFATE 200 MG PO TABS   Oral   Take 600 mg by mouth daily.         . IBUPROFEN 400 MG PO TABS   Oral   Take 400 mg by mouth every 6 (six) hours as needed.         Marland Kitchen PREDNISONE 10 MG PO TABS   Oral   Take 10 mg by mouth 3 (three) times daily.         . ALBUTEROL SULFATE HFA 108 (90 BASE) MCG/ACT IN AERS   Inhalation   Inhale 2 puffs into the lungs every 4 (four) hours as needed for wheezing.   1 Inhaler   0   . AMOXICILLIN 500 MG PO CAPS   Oral   Take 500 mg by mouth 4 (four) times daily. PT TO TAKE FOR 7 DAYS PTS ON DAY 5 OF THERAPY         . AZITHROMYCIN 250 MG PO TABS      Azithromycin 500mg  on day 1, then 250mg  on days 2-4   6 tablet   0   . CETIRIZINE-PSEUDOEPHEDRINE ER 5-120 MG PO TB12   Oral   Take 1 tablet by mouth 2 (two) times daily.   60 tablet   0   . GUAIFENESIN-CODEINE 100-10 MG/5ML PO SYRP   Oral   Take 5 mLs  by mouth 3 (three) times daily as needed for cough.   120 mL   0   . IPRATROPIUM BROMIDE 0.06 % NA SOLN   Nasal   Place 2 sprays into the nose 4 (four) times daily.   15 mL   1   . SODIUM CHLORIDE 0.65 % NA SOLN   Nasal   Place 1 spray into the nose as needed for congestion.   15 mL   12     BP 131/68  Pulse 72  Temp 97.6 F (36.4 C) (Oral)  Resp 20  SpO2 96%  Physical Exam  Nursing note and vitals reviewed. Constitutional: He is oriented to person, place, and time. Vital signs are normal. He appears well-developed and well-nourished. He is active and cooperative.  HENT:  Head: Normocephalic.  Mouth/Throat: Oropharynx is clear and moist. No oropharyngeal exudate.  Eyes: Conjunctivae normal are normal. Pupils are equal, round, and reactive to light. No scleral icterus.  Neck: Trachea normal and normal range of motion. Neck supple.   Cardiovascular: Normal rate, regular rhythm, normal heart sounds and intact distal pulses.   Pulmonary/Chest: Effort normal and breath sounds normal.  Abdominal: Soft. Bowel sounds are normal. There is generalized tenderness. There is no rebound and no guarding.  Lymphadenopathy:    He has no cervical adenopathy.  Neurological: He is alert and oriented to person, place, and time. No cranial nerve deficit or sensory deficit.  Skin: Skin is warm and dry.  Psychiatric: He has a normal mood and affect. His speech is normal and behavior is normal. Judgment and thought content normal. Cognition and memory are normal.    ED Course  Procedures (including critical care time)   Labs Reviewed  POCT H PYLORI SCREEN   Dg Abd 1 View  04/22/2012  *RADIOLOGY REPORT*  Clinical Data: Abdominal pain with diarrhea for 1 week.  ABDOMEN - 1 VIEW  Comparison: Lumbar spine radiographs 05/20/2009.  Findings: The bowel gas pattern is normal.  There is no evidence of bowel wall thickening or suspicious calcification.  There is no supine evidence of free intraperitoneal air.  The osseous structures appear normal.  IMPRESSION: No active abdominal findings.   Original Report Authenticated By: Carey Bullocks, M.D.      1. Abdominal  pain, other specified site   2. Diarrhea       MDM  Clear liquid diet, follow up prn        Johnsie Kindred, NP 04/22/12 [phone]

## 2012-04-22 NOTE — ED Notes (Signed)
Patient transported to X-ray 

## 2012-04-22 NOTE — ED Notes (Signed)
Offered gingerale/sprite-declined by patient

## 2012-04-22 NOTE — ED Notes (Addendum)
Reports stomach virus approx 2 weeks ago involving vomiting and diarrhea.  Treated with imodium.  By the evening, n/v/d/ had stopped.  Patient reports feeling ok/normal for 2-3 days.  Then since thanksgiving day, seems like he has had a change in bowel routine.  Patient reports he is concerned about constipation.  Last bm was 2 days ago.  Denies any constipation history.  Patient reports passing gas without difficulty.  Patient feeling "full" and "bloated".  Has had an intermittent pain in lower abdomen.  Denies urinary symptoms

## 2012-05-14 ENCOUNTER — Emergency Department (HOSPITAL_COMMUNITY)
Admission: EM | Admit: 2012-05-14 | Discharge: 2012-05-14 | Disposition: A | Discharge status: Home / Self Care | Payer: No Typology Code available for payment source | Attending: Emergency Medicine | Admitting: Emergency Medicine

## 2012-05-14 ENCOUNTER — Encounter (HOSPITAL_COMMUNITY): Payer: Self-pay | Admitting: Emergency Medicine

## 2012-05-14 DIAGNOSIS — Y9389 Activity, other specified: Secondary | ICD-10-CM | POA: Insufficient documentation

## 2012-05-14 DIAGNOSIS — S39012A Strain of muscle, fascia and tendon of lower back, initial encounter: Secondary | ICD-10-CM

## 2012-05-14 DIAGNOSIS — F17200 Nicotine dependence, unspecified, uncomplicated: Secondary | ICD-10-CM | POA: Insufficient documentation

## 2012-05-14 DIAGNOSIS — Z79899 Other long term (current) drug therapy: Secondary | ICD-10-CM | POA: Insufficient documentation

## 2012-05-14 DIAGNOSIS — S335XXA Sprain of ligaments of lumbar spine, initial encounter: Secondary | ICD-10-CM | POA: Insufficient documentation

## 2012-05-14 DIAGNOSIS — D869 Sarcoidosis, unspecified: Secondary | ICD-10-CM | POA: Insufficient documentation

## 2012-05-14 MED ORDER — NAPROXEN 500 MG PO TABS: 500.0000 mg | ORAL_TABLET | Freq: Two times a day (BID) | ORAL | Status: DC | PRN

## 2012-05-14 MED ORDER — IBUPROFEN 200 MG PO TABS
600.0000 mg | ORAL_TABLET | Freq: Once | ORAL | Status: AC
  Administered 2012-05-14: 600 mg via ORAL
  Filled 2012-05-14: qty 3

## 2012-05-14 MED ORDER — OXYCODONE-ACETAMINOPHEN 5-325 MG PO TABS: 1.0000 | ORAL_TABLET | ORAL | Status: DC | PRN
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
## 2012-08-11 ENCOUNTER — Emergency Department (HOSPITAL_COMMUNITY): Payer: BC Managed Care – PPO

## 2012-08-11 ENCOUNTER — Encounter (HOSPITAL_COMMUNITY): Payer: Self-pay | Admitting: Emergency Medicine

## 2012-08-11 ENCOUNTER — Emergency Department (HOSPITAL_COMMUNITY)
Admission: EM | Admit: 2012-08-11 | Discharge: 2012-08-11 | Disposition: A | Discharge status: Home / Self Care | Payer: BC Managed Care – PPO | Attending: Emergency Medicine | Admitting: Emergency Medicine

## 2012-08-11 DIAGNOSIS — F17200 Nicotine dependence, unspecified, uncomplicated: Secondary | ICD-10-CM | POA: Insufficient documentation

## 2012-08-11 DIAGNOSIS — T148XXA Other injury of unspecified body region, initial encounter: Secondary | ICD-10-CM

## 2012-08-11 DIAGNOSIS — Y9389 Activity, other specified: Secondary | ICD-10-CM | POA: Insufficient documentation

## 2012-08-11 DIAGNOSIS — S199XXA Unspecified injury of neck, initial encounter: Secondary | ICD-10-CM | POA: Insufficient documentation

## 2012-08-11 DIAGNOSIS — K0889 Other specified disorders of teeth and supporting structures: Secondary | ICD-10-CM

## 2012-08-11 DIAGNOSIS — S0993XA Unspecified injury of face, initial encounter: Secondary | ICD-10-CM | POA: Insufficient documentation

## 2012-08-11 DIAGNOSIS — IMO0002 Reserved for concepts with insufficient information to code with codable children: Secondary | ICD-10-CM | POA: Insufficient documentation

## 2012-08-11 DIAGNOSIS — Z8619 Personal history of other infectious and parasitic diseases: Secondary | ICD-10-CM | POA: Insufficient documentation

## 2012-08-11 DIAGNOSIS — Y92410 Unspecified street and highway as the place of occurrence of the external cause: Secondary | ICD-10-CM | POA: Insufficient documentation

## 2012-08-11 MED ORDER — PENICILLIN V POTASSIUM 500 MG PO TABS: 500.0000 mg | ORAL_TABLET | Freq: Four times a day (QID) | ORAL | Status: AC

## 2012-08-11 MED ORDER — IBUPROFEN 800 MG PO TABS: 800.0000 mg | ORAL_TABLET | Freq: Three times a day (TID) | ORAL | Status: DC

## 2012-08-11 MED ORDER — HYDROCODONE-ACETAMINOPHEN 5-325 MG PO TABS: 2.0000 | ORAL_TABLET | Freq: Four times a day (QID) | ORAL | Status: DC | PRN

## 2012-08-11 MED ORDER — IBUPROFEN 400 MG PO TABS
800.0000 mg | ORAL_TABLET | Freq: Once | ORAL | Status: AC
  Administered 2012-08-11: 800 mg via ORAL
  Filled 2012-08-11: qty 2
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
## 2012-08-12 NOTE — ED Provider Notes (Signed)
Medical screening examination/treatment/procedure(s) were performed by non-physician practitioner and as supervising physician I was immediately available for consultation/collaboration.   Maximo Spratling E Dolores Mcgovern, MD 08/12/12 1540 

## 2012-12-15 ENCOUNTER — Emergency Department (HOSPITAL_COMMUNITY)
Admission: EM | Admit: 2012-12-15 | Discharge: 2012-12-15 | Disposition: A | Discharge status: Home / Self Care | Payer: Self-pay | Attending: Emergency Medicine | Admitting: Emergency Medicine

## 2012-12-15 ENCOUNTER — Encounter (HOSPITAL_COMMUNITY): Payer: Self-pay | Admitting: Emergency Medicine

## 2012-12-15 DIAGNOSIS — R21 Rash and other nonspecific skin eruption: Secondary | ICD-10-CM | POA: Insufficient documentation

## 2012-12-15 DIAGNOSIS — D869 Sarcoidosis, unspecified: Secondary | ICD-10-CM | POA: Insufficient documentation

## 2012-12-15 DIAGNOSIS — F17200 Nicotine dependence, unspecified, uncomplicated: Secondary | ICD-10-CM | POA: Insufficient documentation

## 2012-12-15 MED ORDER — CEPHALEXIN 500 MG PO CAPS: 500.0000 mg | ORAL_CAPSULE | Freq: Four times a day (QID) | ORAL | Status: DC

## 2012-12-15 MED ORDER — PREDNISONE 10 MG PO TABS: 30.0000 mg | ORAL_TABLET | Freq: Every day | ORAL | Status: DC

## 2012-12-15 NOTE — ED Notes (Signed)
Pt has been receiving inj shots and they stopped and he wants his prednisone filled till he can see his doc and wants to see if his skin area is infected. Has appoint with his md in [place] on the 18th of this month.

## 2012-12-15 NOTE — ED Provider Notes (Signed)
CSN: [id number]     Arrival date & time 12/15/12  1321 History    This chart was scribed for Roxy Horseman, non-physician practitioner working with Laray Anger, DO by Leone Payor, ED Scribe. This patient was seen in room WTR9/WTR9 and the patient's care was started at 1321.   First MD Initiated Contact with Patient 12/15/12 1503     Chief Complaint  Patient presents with  . Rash    The history is provided by the patient. No language interpreter was used.    HPI Comments: Nathan Flores. is a 32 y.o. male with past medical history of sarcoidosis who presents to the Emergency Department complaining of a flare up of chronic sarcoidosis. This new episode started 1-2 days ago and is constant, gradually worsening, located to the bilateral arms and face. Pt usually takes prednisone but has run out for a few months. Pt has been seeing a dermatologist locally and was given injections to the face. States his MD is at [facility] but he cannot see him for a couple weeks. Pt is also concerned it may get infected.    Past Medical History  Diagnosis Date  . Sarcoidosis   . Sarcoidosis    Past Surgical History  Procedure Laterality Date  . Lymph node biopsy     No family history on file. History  Substance Use Topics  . Smoking status: Current Every Day Smoker -- 0.50 packs/day for 2 years    Types: Cigarettes  . Smokeless tobacco: Not on file  . Alcohol Use: 0.6 oz/week    1 Cans of beer per week     Comment: occasionally    Review of Systems  Constitutional: Negative for fever.  Skin: Positive for rash.    Allergies  Review of patient's allergies indicates no known allergies.  Home Medications   Current Outpatient Rx  Name  Route  Sig  Dispense  Refill  . ibuprofen (ADVIL,MOTRIN) 200 MG tablet   Oral   Take 400 mg by mouth every 6 (six) hours as needed for pain.          BP 112/78  Pulse 88  Temp(Src) 98 F (36.7 C)  Resp 20  SpO2 95% Physical Exam  Nursing  note and vitals reviewed. Constitutional: He is oriented to person, place, and time. He appears well-developed and well-nourished.  HENT:  Head: Normocephalic and atraumatic.  Eyes: Conjunctivae and EOM are normal. Pupils are equal, round, and reactive to light.  Neck: Normal range of motion. Neck supple.  Cardiovascular: Normal rate, regular rhythm and normal heart sounds.   Pulmonary/Chest: Effort normal and breath sounds normal.  Abdominal: Soft. Bowel sounds are normal.  Musculoskeletal: Normal range of motion.  Neurological: He is alert and oriented to person, place, and time.  Skin: Skin is warm and dry.  Scattered patches of maculopapular rash on the upper extremities and under the nose. Does not appear to be infected.   Psychiatric: He has a normal mood and affect.    ED Course   Procedures (including critical care time)  DIAGNOSTIC STUDIES: Oxygen Saturation is 95% on RA, adequate by my interpretation.    COORDINATION OF CARE: 3:10 PM Discussed treatment plan with pt at bedside and pt agreed to plan.    Labs Reviewed - No data to display No results found. 1. Rash     MDM  Patient with a rash, will treat with prednisone taper. Instructions given to the patient regarding the use of  prednisone. Patient will followup with his rheumatologist. He is stable and ready for discharge. He is concerned about infection of the rash, does not appear infected at this time, but I will give him Keflex as he states that he noticed some discharge a few days ago.  I personally performed the services described in this documentation, which was scribed in my presence. The recorded information has been reviewed and is accurate.    Roxy Horseman, PA-C 12/15/12 1746

## 2012-12-15 NOTE — Progress Notes (Signed)
[facility] provided patient with a list of primary care resources. Patient stated that he was pending [employer].

## 2012-12-15 NOTE — Progress Notes (Signed)
Patient reports he does have a pcp, it's a woma and he can't think of her name.  He reports that she is a doctor with [facility] physicians.

## 2012-12-17 NOTE — ED Provider Notes (Signed)
Medical screening examination/treatment/procedure(s) were performed by non-physician practitioner and as supervising physician I was immediately available for consultation/collaboration.   Aayushi Solorzano M Leeona Mccardle, DO 12/17/12 1622 

## 2012-12-28 ENCOUNTER — Telehealth (HOSPITAL_COMMUNITY): Payer: Self-pay | Admitting: Emergency Medicine

## 2013-03-20 ENCOUNTER — Emergency Department (HOSPITAL_COMMUNITY)
Admission: EM | Admit: 2013-03-20 | Discharge: 2013-03-21 | Disposition: A | Discharge status: Home / Self Care | Payer: BC Managed Care – PPO | Attending: Emergency Medicine | Admitting: Emergency Medicine

## 2013-03-20 ENCOUNTER — Encounter (HOSPITAL_COMMUNITY): Payer: Self-pay | Admitting: Emergency Medicine

## 2013-03-20 DIAGNOSIS — R112 Nausea with vomiting, unspecified: Secondary | ICD-10-CM | POA: Insufficient documentation

## 2013-03-20 DIAGNOSIS — Z792 Long term (current) use of antibiotics: Secondary | ICD-10-CM | POA: Insufficient documentation

## 2013-03-20 DIAGNOSIS — R197 Diarrhea, unspecified: Secondary | ICD-10-CM | POA: Insufficient documentation

## 2013-03-20 DIAGNOSIS — Z8619 Personal history of other infectious and parasitic diseases: Secondary | ICD-10-CM | POA: Insufficient documentation

## 2013-03-20 DIAGNOSIS — F17200 Nicotine dependence, unspecified, uncomplicated: Secondary | ICD-10-CM | POA: Insufficient documentation

## 2013-03-20 DIAGNOSIS — IMO0002 Reserved for concepts with insufficient information to code with codable children: Secondary | ICD-10-CM | POA: Insufficient documentation

## 2013-03-20 MED ORDER — ONDANSETRON HCL 4 MG/2ML IJ SOLN
4.0000 mg | Freq: Once | INTRAMUSCULAR | Status: AC
  Administered 2013-03-20: 4 mg via INTRAVENOUS
  Filled 2013-03-20: qty 2

## 2013-03-20 MED ORDER — SODIUM CHLORIDE 0.9 % IV BOLUS (SEPSIS)
1000.0000 mL | Freq: Once | INTRAVENOUS | Status: AC
  Administered 2013-03-20: 1000 mL via INTRAVENOUS

## 2013-03-20 MED ORDER — ACETAMINOPHEN 325 MG PO TABS
650.0000 mg | ORAL_TABLET | Freq: Once | ORAL | Status: AC
  Administered 2013-03-21: 650 mg via ORAL
  Filled 2013-03-20: qty 2

## 2013-03-20 MED ORDER — KETOROLAC TROMETHAMINE 30 MG/ML IJ SOLN
15.0000 mg | Freq: Once | INTRAMUSCULAR | Status: AC
  Administered 2013-03-20: 15 mg via INTRAVENOUS
  Filled 2013-03-20: qty 1

## 2013-03-20 NOTE — ED Notes (Signed)
Pt reports generalized abdominal pain that began at 1500 today. Pt also reports nausea, emesis, and diarrhea that began around 1400. Pt states he ate [place] for breakfast, and states he is concerned regarding food poisoning. Pt also reports generalized weakness. Pt is A/O x4, oxygen saturation of 100% on room air and is talking in complete sentences without shortness of breath.

## 2013-03-20 NOTE — ED Provider Notes (Signed)
CSN: [id number]     Arrival date & time 03/20/13  2151 History   First MD Initiated Contact with Patient 03/20/13 2158     Chief Complaint  Patient presents with  . Abdominal Pain  . Emesis   (Consider location/radiation/quality/duration/timing/severity/associated sxs/prior Treatment) HPI Comments: Patient with h/o sarcoidosis -- presents with onset N/V/D beginning at 1400. At approx 1500 he began having generalized abd pain. Also chills, but no fever. No urinary sx. No tx PTA. No h/o abd surgery. No heavy NSAID or EtOH use. Patient thinks he has food poisoning from [place] food he ate this morning. Another person who had same food has lesser sx, diarrhea only. The onset of this condition was acute. The course is constant. Aggravating factors: none. Alleviating factors: none.    Patient is a 32 y.o. male presenting with abdominal pain and vomiting. The history is provided by the patient.  Abdominal Pain Associated symptoms: diarrhea, nausea and vomiting   Associated symptoms: no chest pain, no cough, no dysuria, no fever and no sore throat   Emesis Associated symptoms: abdominal pain and diarrhea   Associated symptoms: no myalgias and no sore throat     Past Medical History  Diagnosis Date  . Sarcoidosis   . Sarcoidosis    Past Surgical History  Procedure Laterality Date  . Lymph node biopsy     No family history on file. History  Substance Use Topics  . Smoking status: Current Every Day Smoker -- 0.50 packs/day for 2 years    Types: Cigarettes  . Smokeless tobacco: Not on file  . Alcohol Use: 0.6 oz/week    1 Cans of beer per week     Comment: occasionally    Review of Systems  Constitutional: Negative for fever and appetite change.  HENT: Negative for rhinorrhea and sore throat.   Eyes: Negative for redness.  Respiratory: Negative for cough.   Cardiovascular: Negative for chest pain.  Gastrointestinal: Positive for nausea, vomiting, abdominal pain and diarrhea.  Negative for blood in stool.       Negative for hematemesis  Genitourinary: Negative for dysuria.  Musculoskeletal: Negative for myalgias.  Skin: Negative for rash.  Neurological: Negative for light-headedness.    Allergies  Review of patient's allergies indicates no known allergies.  Home Medications   Current Outpatient Rx  Name  Route  Sig  Dispense  Refill  . cephALEXin (KEFLEX) 500 MG capsule   Oral   Take 1 capsule (500 mg total) by mouth 4 (four) times daily.   40 capsule   0   . ibuprofen (ADVIL,MOTRIN) 200 MG tablet   Oral   Take 400 mg by mouth every 6 (six) hours as needed for pain.         . predniSONE (DELTASONE) 10 MG tablet   Oral   Take 3 tablets (30 mg total) by mouth daily.   42 tablet   0    BP 129/66  Pulse 88  Temp(Src) 97.6 F (36.4 C) (Oral)  Resp 20  SpO2 100% Physical Exam  Nursing note and vitals reviewed. Constitutional: He appears well-developed and well-nourished.  Appears uncomfortable.   HENT:  Head: Normocephalic and atraumatic.  Eyes: Conjunctivae are normal. Right eye exhibits no discharge. Left eye exhibits no discharge.  Neck: Normal range of motion. Neck supple.  Cardiovascular: Normal rate, regular rhythm and normal heart sounds.   Pulmonary/Chest: Effort normal and breath sounds normal. No respiratory distress. He has no wheezes.  Abdominal: Soft. Bowel sounds  are normal. He exhibits no distension. There is tenderness (mild, generalized). There is no rebound and no guarding.  Neurological: He is alert.  Skin: Skin is warm and dry.  Psychiatric: He has a normal mood and affect.    ED Course  Procedures (including critical care time) Labs Review Labs Reviewed - No data to display Imaging Review No results found.  EKG Interpretation   None      10:18 PM Patient seen and examined. Work-up initiated. Medications ordered.   Vital signs reviewed and are as follows: Filed Vitals:   03/20/13 2210  BP: 129/66   Pulse: 88  Temp: 97.6 F (36.4 C)  Resp: 20   12:16 AM Patient has developed fever but is feeling better after toradol/zofran. Tylenol/PO challenge ordered. Abd is soft, non-tender.   1:30 AM Patient has had 2 cups of ginger ale without vomiting. Abd remains soft, NT. Temp improved to 100.3F. He wants to go home. Will re-dose zofran prior to d/c. Father to watch him tonight.   Counseled on clear liquids for 24 hrs, then b.r.a.t. Diet.   The patient was urged to return to the Emergency Department immediately with worsening of current symptoms, worsening abdominal pain, persistent vomiting, blood noted in stools, fever, or any other concerns. The patient verbalized understanding.    MDM   1. Nausea vomiting and diarrhea    Patient with symptoms consistent with viral gastroenteritis/food poisoning.  Symptoms are controlled in ED, non-focal exam. Vitals are stable, pt developed fever which improved with tylenol.  No signs of dehydration, now tolerating PO's.  Lungs are clear.  No focal abdominal pain, no concern for appendicitis, cholecystitis, pancreatitis, ruptured viscus, UTI, kidney stone, or any other abdominal etiology.  Supportive therapy indicated with return if symptoms worsen.  Do not feel labs are warranted given sx for only several hours, non-focal exam, improvement with treatment in ED and serial exams that do not indicate worsening. Patient is on prednisone -- I would expect the WBC count to be elevated regardless and this to cloud clinical picture. I do not feel CT is needed at this time given history, exam, and progression during ED course. Patient discharged in improved condition, non-toxic/well appearance.      Renne Crigler, PA-C 03/21/13 [phone]

## 2013-03-21 MED ORDER — ONDANSETRON 4 MG PO TBDP: 4.0000 mg | ORAL_TABLET | Freq: Three times a day (TID) | ORAL | Status: DC | PRN

## 2013-03-21 MED ORDER — ONDANSETRON HCL 4 MG/2ML IJ SOLN
4.0000 mg | Freq: Once | INTRAMUSCULAR | Status: AC
  Administered 2013-03-21: 4 mg via INTRAVENOUS
  Filled 2013-03-21: qty 2

## 2013-03-21 NOTE — ED Notes (Signed)
Opened chart after pt called stating he lost $100.00 while in ED room 4.

## 2013-03-23 NOTE — ED Provider Notes (Signed)
Medical screening examination/treatment/procedure(s) were performed by non-physician practitioner and as supervising physician I was immediately available for consultation/collaboration.  EKG Interpretation   None         Laray Anger, DO 03/23/13 0126

## 2013-06-07 IMAGING — CR DG CHEST 2V
2 series · 2 of 2 positions shown · non-contrast
Comparison: 10/13/2004

CLINICAL DATA: Sarcoidosis, history of lymphadenopathy

CHEST - 2 VIEW

[view not recorded (1 of 2)]
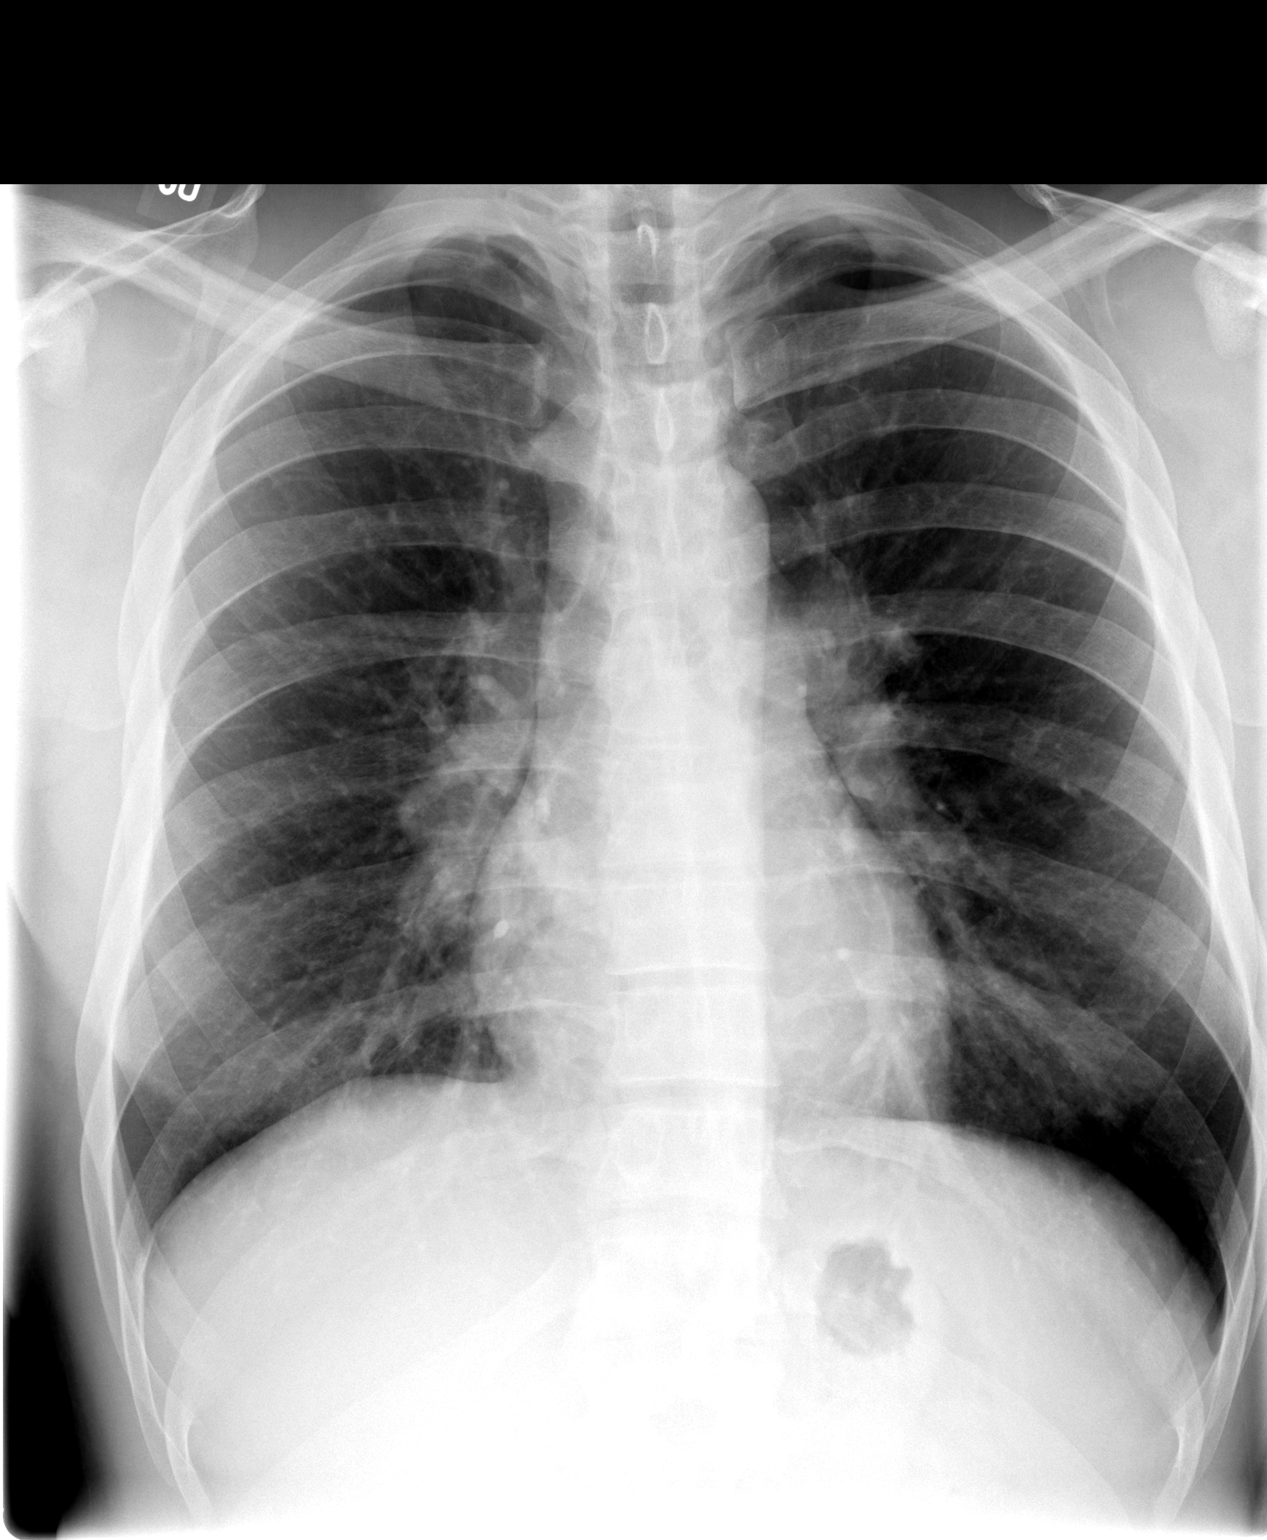

[view not recorded (2 of 2)]
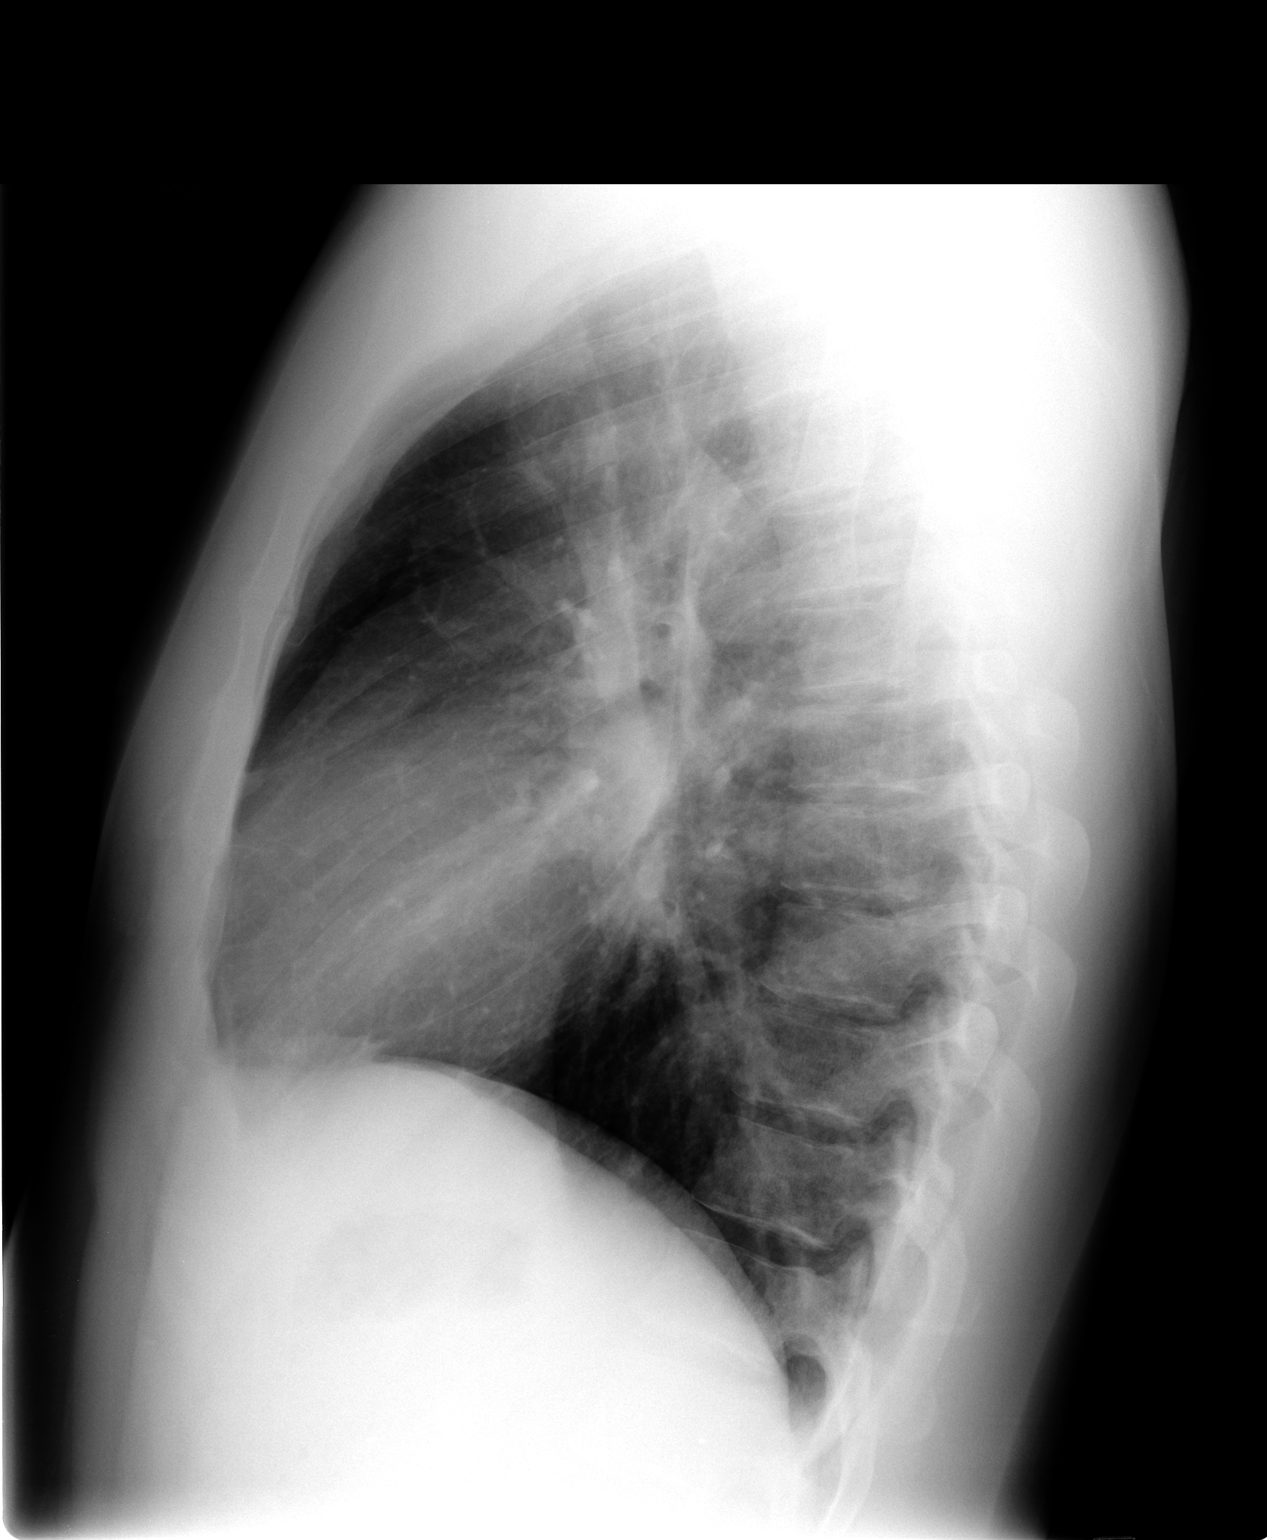

[2 of 2 positions shown; findings below may reference images not displayed]

FINDINGS: Lungs are clear. No pleural effusion or pneumothorax.

The heart is normal in size.  Stable bilateral hilar prominence,
likely reflecting lymphadenopathy in this patient with known
sarcoidosis.

Visualized osseous structures are within normal limits.
IMPRESSION: Stable bilateral hilar prominence, likely reflecting
lymphadenopathy in this patient with known sarcoidosis.

Otherwise, no acute disease is seen in the chest.

## 2013-12-29 ENCOUNTER — Encounter (HOSPITAL_COMMUNITY): Payer: Self-pay | Admitting: Emergency Medicine

## 2013-12-29 ENCOUNTER — Emergency Department (HOSPITAL_COMMUNITY)
Admission: EM | Admit: 2013-12-29 | Discharge: 2013-12-29 | Disposition: A | Discharge status: Home / Self Care | Payer: BC Managed Care – PPO | Attending: Emergency Medicine | Admitting: Emergency Medicine

## 2013-12-29 DIAGNOSIS — R21 Rash and other nonspecific skin eruption: Secondary | ICD-10-CM | POA: Insufficient documentation

## 2013-12-29 DIAGNOSIS — IMO0002 Reserved for concepts with insufficient information to code with codable children: Secondary | ICD-10-CM | POA: Insufficient documentation

## 2013-12-29 DIAGNOSIS — Z79899 Other long term (current) drug therapy: Secondary | ICD-10-CM | POA: Insufficient documentation

## 2013-12-29 DIAGNOSIS — F17200 Nicotine dependence, unspecified, uncomplicated: Secondary | ICD-10-CM | POA: Insufficient documentation

## 2013-12-29 DIAGNOSIS — B353 Tinea pedis: Secondary | ICD-10-CM | POA: Insufficient documentation

## 2013-12-29 DIAGNOSIS — B360 Pityriasis versicolor: Secondary | ICD-10-CM | POA: Insufficient documentation

## 2013-12-29 MED ORDER — TERBINAFINE HCL 1 % EX CREA: 1.0000 "application " | TOPICAL_CREAM | Freq: Two times a day (BID) | CUTANEOUS | Status: DC

## 2013-12-29 MED ORDER — SELENIUM SULFIDE 2.5 % EX LOTN: 1.0000 "application " | TOPICAL_LOTION | Freq: Every day | CUTANEOUS | Status: DC | PRN

## 2013-12-29 NOTE — ED Provider Notes (Signed)
CSN: [id number]     Arrival date & time 12/29/13  1044 History   First MD Initiated Contact with Patient 12/29/13 1105     Chief Complaint  Patient presents with  . Rash    back  . peeling skin      right     (Consider location/radiation/quality/duration/timing/severity/associated sxs/prior Treatment) Patient is a 33 y.o. male presenting with rash. The history is provided by the patient and medical records. No language interpreter was used.  Rash Associated symptoms: no abdominal pain, no diarrhea, no fatigue, no fever, no headaches, no nausea, no shortness of breath, not vomiting and not wheezing     Nathan Flores is a 33 y.o. male  with a hx of sarcoidosis presents to the Emergency Department complaining of gradual, persistent, progressively worsening rash on his shoudlers onset several weeks ago.  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  He reports the rash on his back does not itch or feel uncomfortable. He reports wearing sweaty socks for long periods of time and sometimes his feet itch.  He also reports peeling skin on his feet, right worse than left.  Pt reports putting lotion on the rash without relief.    Nothing makes it better and nothing makes it worse.  Pt denies fever, chills, headache, neck pain, chest pain, SOB, abd pain, N/V/D, weakness, dizziness, syncope, dysuria, neck stiffness, neck pain.     Past Medical History  Diagnosis Date  . Sarcoidosis   . Sarcoidosis    Past Surgical History  Procedure Laterality Date  . Lymph node biopsy     No family history on file. History  Substance Use Topics  . Smoking status: Current Every Day Smoker -- 0.50 packs/day for 2 years    Types: Cigarettes  . Smokeless tobacco: Not on file  . Alcohol Use: 0.6 oz/week    1 Cans of beer per week     Comment: occasionally    Review of Systems  Constitutional: Negative for fever, diaphoresis, appetite change, fatigue and unexpected  weight change.  HENT: Negative for mouth sores.   Eyes: Negative for visual disturbance.  Respiratory: Negative for cough, chest tightness, shortness of breath and wheezing.   Cardiovascular: Negative for chest pain.  Gastrointestinal: Negative for nausea, vomiting, abdominal pain, diarrhea and constipation.  Endocrine: Negative for polydipsia, polyphagia and polyuria.  Genitourinary: Negative for dysuria, urgency, frequency and hematuria.  Musculoskeletal: Negative for back pain and neck stiffness.  Skin: Positive for rash.  Allergic/Immunologic: Negative for immunocompromised state.  Neurological: Negative for syncope, light-headedness and headaches.  Hematological: Does not bruise/bleed easily.  Psychiatric/Behavioral: Negative for sleep disturbance. The patient is not nervous/anxious.       Allergies  Review of patient's allergies indicates no known allergies.  Home Medications   Prior to Admission medications   Medication Sig Start Date End Date Taking? Authorizing Provider  ibuprofen (ADVIL,MOTRIN) 200 MG tablet Take 400 mg by mouth every 6 (six) hours as needed for moderate pain.   Yes Historical Provider, MD  predniSONE (DELTASONE) 20 MG tablet Take 30 mg by mouth daily with breakfast.   Yes Historical Provider, MD  selenium sulfide (SELSUN) 2.5 % shampoo Apply 1 application topically daily as needed for irritation (on your back and neck). 12/29/13   Marigene Erler, PA-C  terbinafine (LAMISIL) 1 % cream Apply 1 application topically 2 (two) times daily. To your feet 12/29/13   Abigail Butts, PA-C  BP 130/70  Pulse 69  Temp(Src) 97.9 F (36.6 C) (Oral)  Resp 18  SpO2 100% Physical Exam  Nursing note and vitals reviewed. Constitutional: He appears well-developed and well-nourished. No distress.  Awake, alert, nontoxic appearance  HENT:  Head: Normocephalic and atraumatic.  Mouth/Throat: Oropharynx is clear and moist. No oropharyngeal exudate.  Eyes:  Conjunctivae are normal. No scleral icterus.  Neck: Normal range of motion. Neck supple.  Cardiovascular: Normal rate, regular rhythm and intact distal pulses.   No murmur heard. Pulmonary/Chest: Effort normal and breath sounds normal. No respiratory distress. He has no wheezes. He has no rales.  Equal chest expansion  Abdominal: Soft. Bowel sounds are normal. He exhibits no distension and no mass. There is no tenderness. There is no rebound and no guarding.  Musculoskeletal: Normal range of motion. He exhibits no edema.  Neurological: He is alert.  Speech is clear and goal oriented Moves extremities without ataxia  Skin: Skin is warm and dry. Rash noted. He is not diaphoretic. No erythema.  Diffuse, Fine silver-white scaling,with dry, hyperkeratotic skin on the sole of the foot; cracking noted on the sole as well; no erythema or induration to suggest secondary infection  Hypopigmented macular rash with scaling located on the back, neck and upper trunk, no erythema or induration to suggest secondary infection  Psychiatric: He has a normal mood and affect.    ED Course  Procedures (including critical care time) Labs Review Labs Reviewed - No data to display  Imaging Review No results found.   EKG Interpretation None      MDM   Final diagnoses:  Tinea versicolor  Tinea pedis of right foot   Nathan Flores presents with rash on his back, neck and upper chest consistent with tinea versicolor. Patient wears sweaty shirt for a long period of time and this is likely the reason for this. Patient also complaining of rash on his foot consistent with tinea pedis. Patient again endorses wearing sweaty socks for many hours at a time.  Patient without medical history aside from sarcoidosis. No history of diabetes. No fevers or chills, nausea or vomiting.  Will discharge home with selenium sulfide for any of versicolor and Lamisil for the otitis. Patient is to followup with dermatology for  further evaluation.  BP 130/70  Pulse 69  Temp(Src) 97.9 F (36.6 C) (Oral)  Resp 18  SpO2 100%   Abigail Butts, PA-C 12/29/13 1203

## 2013-12-29 NOTE — Progress Notes (Signed)
[facility] Community Liaison Stacy,  ° °Provided pt with a list of primary care resources and a [facility] Card application to help patient establish primary care.  °

## 2013-12-29 NOTE — ED Notes (Signed)
Pt c/o rash on upper back/shoulders that has been there about couple months.  Pt also c/o skin peeling on bottom of right foot for several weeks. Pt denies itching or burning. Just when he picks the skin off he can picks to much.

## 2013-12-29 NOTE — Discharge Instructions (Signed)
1. Medications: Selenium sulfide to the back and neck, Lamisil to the bilateral feet, usual home medications 2. Treatment: rest, drink plenty of fluids, change socks and sweaty shirts routinely 3. Follow Up: Please followup with your primary doctor for discussion of your diagnoses and further evaluation after today's visit; if you do not have a primary care doctor use the resource guide provided to find one; please also follow up with dermatology   Athlete's Foot Athlete's foot (tinea pedis) is a fungal infection of the skin on the feet. It often occurs on the skin between the toes or underneath the toes. It can also occur on the soles of the feet. Athlete's foot is more likely to occur in hot, humid weather. Not washing your feet or changing your socks often enough can contribute to athlete's foot. The infection can spread from person to person (contagious). CAUSES Athlete's foot is caused by a fungus. This fungus thrives in warm, moist places. Most people get athlete's foot by sharing shower stalls, towels, and wet floors with an infected person. People with weakened immune systems, including those with diabetes, may be more likely to get athlete's foot. SYMPTOMS   Itchy areas between the toes or on the soles of the feet.  White, flaky, or scaly areas between the toes or on the soles of the feet.  Tiny, intensely itchy blisters between the toes or on the soles of the feet.  Tiny cuts on the skin. These cuts can develop a bacterial infection.  Thick or discolored toenails. DIAGNOSIS  Your caregiver can usually tell what the problem is by doing a physical exam. Your caregiver may also take a skin sample from the rash area. The skin sample may be examined under a microscope, or it may be tested to see if fungus will grow in the sample. A sample may also be taken from your toenail for testing. TREATMENT  Over-the-counter and prescription medicines can be used to kill the fungus. These medicines  are available as powders or creams. Your caregiver can suggest medicines for you. Fungal infections respond slowly to treatment. You may need to continue using your medicine for several weeks. PREVENTION   Do not share towels.  Wear sandals in wet areas, such as shared locker rooms and shared showers.  Keep your feet dry. Wear shoes that allow air to circulate. Wear cotton or wool socks. HOME CARE INSTRUCTIONS   Take medicines as directed by your caregiver. Do not use steroid creams on athlete's foot.  Keep your feet clean and cool. Wash your feet daily and dry them thoroughly, especially between your toes.  Change your socks every day. Wear cotton or wool socks. In hot climates, you may need to change your socks 2 to 3 times per day.  Wear sandals or canvas tennis shoes with good air circulation.  If you have blisters, soak your feet in Burow's solution or Epsom salts for 20 to 30 minutes, 2 times a day to dry out the blisters. Make sure you dry your feet thoroughly afterward. SEEK MEDICAL CARE IF:   You have a fever.  You have swelling, soreness, warmth, or redness in your foot.  You are not getting better after 7 days of treatment.  You are not completely cured after 30 days.  You have any problems caused by your medicines. MAKE SURE YOU:   Understand these instructions.  Will watch your condition.  Will get help right away if you are not doing well or get worse. Document  Released: 05/04/2000 Document Revised: 07/30/2011 Document Reviewed: 02/23/2011 [facility] Patient Information 2015 [place], [place]. This information is not intended to replace advice given to you by your health care provider. Make sure you discuss any questions you have with your health care provider.   Tinea Versicolor Tinea versicolor is a common yeast infection of the skin. This condition becomes known when the yeast on our skin starts to overgrow (yeast is a normal inhabitant on our skin). This  condition is noticed as white or light brown patches on brown skin, and is more evident in the summer on tanned skin. These areas are slightly scaly if scratched. The light patches from the yeast become evident when the yeast creates "holes in your suntan". This is most often noticed in the summer. The patches are usually located on the chest, back, pubis, neck and body folds. However, it may occur on any area of body. Mild itching and inflammation (redness or soreness) may be present. DIAGNOSIS  The diagnosisof this is made clinically (by looking). Cultures from samples are usually not needed. Examination under the microscope may help. However, yeast is normally found on skin. The diagnosis still remains clinical. Examination under Wood's Ultraviolet Light can determine the extent of the infection. TREATMENT  This common infection is usually only of cosmetic (only a concern to your appearance). It is easily treated with dandruff shampoo used during showers or bathing. Vigorous scrubbing will eliminate the yeast over several days time. The light areas in your skin may remain for weeks or months after the infection is cured unless your skin is exposed to sunlight. The lighter or darker spots caused by the fungus that remain after complete treatment are not a sign of treatment failure; it will take a long time to resolve. Your caregiver may recommend a number of commercial preparations or medication by mouth if home care is not working. Recurrence is common and preventative medication may be necessary. This skin condition is not highly contagious. Special care is not needed to protect close friends and family members. Normal hygiene is usually enough. Follow up is required only if you develop complications (such as a secondary infection from scratching), if recommended by your caregiver, or if no relief is obtained from the preparations used. Document Released: 05/04/2000 Document Revised: 07/30/2011 Document  Reviewed: 06/16/2008 [facility] Patient Information 2015 [place], [place]. This information is not intended to replace advice given to you by your health care provider. Make sure you discuss any questions you have with your health care provider.   Emergency Department Resource Guide 1) Find a Doctor and Pay Out of Pocket Although you won't have to find out who is covered by your insurance plan, it is a good idea to ask around and get recommendations. You will then need to call the office and see if the doctor you have chosen will accept you as a new patient and what types of options they offer for patients who are self-pay. Some doctors offer discounts or will set up payment plans for their patients who do not have insurance, but you will need to ask so you aren't surprised when you get to your appointment.  2) Contact Your Local Health Department Not all health departments have doctors that can see patients for sick visits, but many do, so it is worth a call to see if yours does. If you don't know where your local health department is, you can check in your phone book. The CDC also has a tool to help  you locate your state's health department, and many state websites also have listings of all of their local health departments.  3) Find a [facility] If your illness is not likely to be very severe or complicated, you may want to try a walk in clinic. These are popping up all over the country in pharmacies, drugstores, and shopping centers. They're usually staffed by nurse practitioners or physician assistants that have been trained to treat common illnesses and complaints. They're usually fairly quick and inexpensive. However, if you have serious medical issues or chronic medical problems, these are probably not your best option.  No Primary Care Doctor: - Call Health Connect at  [phone] - they can help you locate a primary care doctor that  accepts your insurance, provides certain services,  etc. - Physician Referral Service- [phone]  Chronic Pain Problems: Organization         Address  Phone   Notes  [facility]  [phone] Patients need to be referred by their primary care doctor.   Medication Assistance: Organization         Address  Phone   Notes  [facility] Medication [facility] [address] [place], [place] [address] [phone] --Must be a resident of [facility] -- Must have NO insurance coverage whatsoever (no Medicaid/ Medicare, etc.) -- The pt. MUST have a primary care doctor that directs their care regularly and follows them in the community   MedAssist  [phone]   [employer]  [phone]    Agencies that provide inexpensive medical care: Organization         Address  Phone   Notes  [place]  [phone]   [facility]    [phone]   [facility] [place], [place] [address] [phone]   [place] [address] [place] [phone]   Planned Parenthood    [phone]   [facility]    [phone]   [place] and [address], [place] Phone:  [phone], Fax:  [phone] Hours of Operation:  9 am - 6 pm, M-F.  Also accepts Medicaid/Medicare and self-pay.  [facility] for [place] [address], [address], [place] Phone: [phone], Fax: [phone]. Hours of Operation:  8:30 am - 5:30 pm, M-F.  Also accepts Medicaid and self-pay.  [facility] [place] [address] [place] Phone: [phone]   [place], [place], [place] [phone], Ext. [phone] Mondays & Thursdays: 7-9 AM.  First 15 patients are seen on a first come, first serve basis.    [place] Providers:  Organization         Address  Phone   Notes  [facility] [address], [place] [phone] Also accepts self-pay patients.  [facility] [address], [place]  [phone]   [place], [address], [place] [phone]   [facility] [address] [place] [phone]   Lucianne Lei [address], [place]   [phone] Only accepts [place] Access [place] patients after they have their name applied to their card.   Self-Pay (no insurance) in [facility]:  Organization  Address  Phone   Notes  Sickle Cell Patients, [facility] Internal Medicine [place] [phone]   [facility] Care [place] [phone]   [facility] [place]  [address], [address], [place] [phone]   [facility]/Dr. Osei-Bonsu  [address] [place] or [address], [place] [phone] Phone number for both [place] and [place] locations is the same.  Urgent Medical and [facility] [address] [place] [phone]   [facility] [address] [place] or [address] [phone]   [facility] [address] [place] [phone], phone; [phone], fax Sees patients 1st and 3rd Saturday of every month.  Must not qualify for public or private insurance (i.e. Medicaid, Medicare, [place] Health Choice, Veterans' Benefits)  Household income should be no more than 200% of the poverty level The clinic cannot treat you if you are pregnant or think you are pregnant  Sexually transmitted diseases are not treated at the clinic.    Dental Care: Organization         Address  Phone  Notes  [facility] of [facility] [place] [phone] Accepts children up to  age 47 who are enrolled in [place] or [place]; pregnant women with a Medicaid card; and children who have applied for Medicaid or [place] Health Choice, but were declined, whose parents can pay a reduced fee at time of service.  [facility] Department of [facility]  [address], [place] [phone] Accepts children up to age 59 who are enrolled in [place] or [place]; pregnant women with a Medicaid card; and children who have applied for Medicaid or [place] Health Choice, but were declined, whose parents can pay a reduced fee at time of service.  [place] Adult Dental Access PROGRAM  [place] [phone] Patients are seen by appointment only. Walk-ins are not accepted. [place] will see patients 42 years of age and older. Monday - Tuesday (8am-5pm) Most Wednesdays (8:30-5pm) $30 per visit, cash only  [facility] Adult Dental Access PROGRAM  [address], [facility] [phone] Patients are seen by appointment only. Walk-ins are not accepted. [place] will see patients 77 years of age and older. One Wednesday Evening (Monthly: Volunteer Based).  $30 per visit, cash only  [place]  [phone] for adults; Children under age 57, call Graduate Pediatric Dentistry at [phone]. Children aged 78-14, please call [phone] to request a pediatric application.  Dental services are provided in all areas of dental care including fillings, crowns and bridges, complete and partial dentures, implants, gum treatment, root canals, and extractions. Preventive care is also provided. Treatment is provided to both adults and children. Patients are selected via a lottery and there is often a waiting list.   [facility] [address], [place]  [phone] [email or web address]   [facility] [address] [place], [place] [phone], Ext. [phone] Second and Fourth Thursday of  each month, opens at 6:30 AM; Clinic ends at 9 AM.  Patients are seen on a first-come first-served basis, and a limited number are seen during each clinic.   [facility]  [address] [place], [place]  [place], [place] [phone]   Eligibility Requirements You must have lived in [place], [place], or [place] counties for at least the last three months.   You cannot be eligible for state or federal sponsored [employer], including [employer], [place], or [employer].   You generally cannot be eligible for healthcare insurance through your employer.    How to apply: Eligibility screenings are held every Tuesday and Wednesday afternoon from 1:00 pm until 4:00 pm. You do not need an appointment for the interview!  [facility] [address], [place], [place]   [place]  [place] Department  [place]  [phone]    Behavioral Health Resources in the Community: Intensive Outpatient Programs Organization         Address  Phone  Notes  [place]. [address], [place], [place] [phone]   [facility] Outpatient [address], [place], [place]   ADS: Alcohol & Drug Svcs [address] [place], [place]   [place] [address]  [place], [place] or [phone]   Substance Abuse Resources Organization         Address  Phone  Notes  Alcohol and Drug Services  [phone]   [place]  [phone]   [facility] [place]   Chinita Pester  [phone]   Residential & Outpatient Substance Abuse Program  [phone]   Psychological Services Organization         Address  Phone  Notes  [facility] [place]  [place]  [phone]   [place] [address],  [place] or [phone]    Mobile Crisis Teams Organization         Address  Phone  Notes  [facility], Mobile Crisis Care Unit  [phone]   [facility]  [address] [place], [place]   [place] [address] [place] [phone]    Self-Help/Support Groups Organization         Address  Phone             Notes  [place]. of [place] - variety of support groups  [place] Call for more information  Narcotics Anonymous (NA), [facility] [address], [employer] [place]  2 meetings at this location   Special educational needs teacher         Address  Phone  Notes  ASAP Residential Treatment [place],    [place]  [phone]   [facility]  [address], [place] [phone], [place], [place]   [place], [place] [phone] Admissions: 8am-3pm M-F  Incentives Substance [place] [address]    [place], [place] [phone]   [facility] [address] Jadene Pierini [place], [place]   [facility] [address]  [place], [place]   [facility] - Intensive Outpatient [address] Kristeen [address], [place], [place]   [facility] ([place].,  [place], [place] or [phone]   [facility] Services (RTS) [address], [place], [place] Accepts Medicaid  [facility] [place] [address]  [place] [phone] Substance Abuse/Addiction Treatment   [facility] Resources Organization  Address  Phone  Notes  [facility]  [phone]   Domenic Schwab, PhD [address] [place]lis [place], [place]   [phone] or [phone]   [place], [place] [phone]     [address], [place], [place] [phone] Insurance/Medicaid/sponsorship through [facility] and Families [address]                                    [place], [place] [phone] [place] [address], [place] [phone]    Dr. Adele Schilder  [phone]   [facility] Dept. 1) [address] [place] 2) [place] 3)  [place] [address], [place] [phone]  [phone]   [place] [phone] or [phone] (After Hours)

## 2013-12-30 NOTE — ED Provider Notes (Signed)
Medical screening examination/treatment/procedure(s) were performed by non-physician practitioner and as supervising physician I was immediately available for consultation/collaboration.  Leota Jacobsen, MD 12/30/13 [phone]

## 2014-03-13 ENCOUNTER — Encounter (HOSPITAL_COMMUNITY): Payer: Self-pay | Admitting: Emergency Medicine

## 2014-03-13 DIAGNOSIS — Z720 Tobacco use: Secondary | ICD-10-CM | POA: Insufficient documentation

## 2014-03-13 DIAGNOSIS — J019 Acute sinusitis, unspecified: Secondary | ICD-10-CM | POA: Insufficient documentation

## 2014-03-13 DIAGNOSIS — B360 Pityriasis versicolor: Secondary | ICD-10-CM | POA: Insufficient documentation

## 2014-03-13 DIAGNOSIS — R112 Nausea with vomiting, unspecified: Secondary | ICD-10-CM | POA: Insufficient documentation

## 2014-03-13 DIAGNOSIS — D869 Sarcoidosis, unspecified: Secondary | ICD-10-CM | POA: Insufficient documentation

## 2014-03-13 LAB — COMPREHENSIVE METABOLIC PANEL
ALBUMIN: 4.1 g/dL (ref 3.5–5.2)
ALT: 42 U/L (ref 0–53)
AST: 52 U/L — AB (ref 0–37)
Alkaline Phosphatase: 73 U/L (ref 39–117)
Anion gap: 13 (ref 5–15)
BUN: 12 mg/dL (ref 6–23)
CALCIUM: 9.4 mg/dL (ref 8.4–10.5)
CO2: 26 mEq/L (ref 19–32)
CREATININE: 1.37 mg/dL — AB (ref 0.50–1.35)
Chloride: 101 mEq/L (ref 96–112)
GFR calc Af Amer: 76 mL/min — ABNORMAL LOW (ref >90)
GFR calc non Af Amer: 66 mL/min — ABNORMAL LOW (ref >90)
Glucose, Bld: 96 mg/dL (ref 70–99)
Potassium: 4 mEq/L (ref 3.7–5.3)
Sodium: 140 mEq/L (ref 137–147)
Total Bilirubin: 0.6 mg/dL (ref 0.3–1.2)
Total Protein: 7.3 g/dL (ref 6.0–8.3)

## 2014-03-13 LAB — CBC WITH DIFFERENTIAL/PLATELET
BASOS ABS: 0 10*3/uL (ref 0.0–0.1)
BASOS PCT: 1 % (ref 0–1)
EOS ABS: 0.3 10*3/uL (ref 0.0–0.7)
EOS PCT: 8 % — AB (ref 0–5)
HEMATOCRIT: 39.8 % (ref 39.0–52.0)
Hemoglobin: 13.8 g/dL (ref 13.0–17.0)
Lymphocytes Relative: 26 % (ref 12–46)
Lymphs Abs: 1 10*3/uL (ref 0.7–4.0)
MCH: 27.3 pg (ref 26.0–34.0)
MCHC: 34.7 g/dL (ref 30.0–36.0)
MCV: 78.8 fL (ref 78.0–100.0)
MONO ABS: 0.6 10*3/uL (ref 0.1–1.0)
Monocytes Relative: 15 % — ABNORMAL HIGH (ref 3–12)
NEUTROS ABS: 1.9 10*3/uL (ref 1.7–7.7)
Neutrophils Relative %: 50 % (ref 43–77)
Platelets: 137 10*3/uL — ABNORMAL LOW (ref 150–400)
RBC: 5.05 MIL/uL (ref 4.22–5.81)
RDW: 12.1 % (ref 11.5–15.5)
WBC: 3.7 10*3/uL — ABNORMAL LOW (ref 4.0–10.5)

## 2014-03-13 MED ORDER — ONDANSETRON 4 MG PO TBDP
8.0000 mg | ORAL_TABLET | Freq: Once | ORAL | Status: AC
  Administered 2014-03-13: 8 mg via ORAL
  Filled 2014-03-13: qty 2

## 2014-03-13 NOTE — ED Notes (Signed)
Patient arrives with multiple complaints. Primary reason for presentation tonight is nausea and vomiting. States that he started having sinus pressure and congestion about 2 weeks ago. Has been taking nyquil and sudafed for the duration of symptoms. States that today his stomach has felt "not right" and tonight he began to vomit. Additional complaints tonight include: rash on top of right foot, diffuse rash across shoulders. States history of sarcoidosis and chronic prednisone use. States that he has been off the prednisone for 1 month now.

## 2014-03-13 NOTE — ED Notes (Signed)
Patient additionally states intolerance of PO intake throughout today.

## 2014-03-14 ENCOUNTER — Emergency Department (HOSPITAL_COMMUNITY)
Admission: EM | Admit: 2014-03-14 | Discharge: 2014-03-14 | Disposition: A | Discharge status: Home / Self Care | Payer: BC Managed Care – PPO | Attending: Emergency Medicine | Admitting: Emergency Medicine

## 2014-03-14 DIAGNOSIS — B360 Pityriasis versicolor: Secondary | ICD-10-CM

## 2014-03-14 DIAGNOSIS — R110 Nausea: Secondary | ICD-10-CM

## 2014-03-14 DIAGNOSIS — J019 Acute sinusitis, unspecified: Secondary | ICD-10-CM

## 2014-03-14 MED ORDER — PREDNISONE 20 MG PO TABS
30.0000 mg | ORAL_TABLET | Freq: Every day | ORAL | Status: DC
Start: 2014-03-14 — End: 2014-06-08

## 2014-03-14 MED ORDER — ONDANSETRON HCL 4 MG PO TABS: 4.0000 mg | ORAL_TABLET | Freq: Four times a day (QID) | ORAL | Status: DC

## 2014-03-14 MED ORDER — SELENIUM SULFIDE 1 % EX LOTN: 1.0000 "application " | TOPICAL_LOTION | Freq: Every day | CUTANEOUS | Status: DC

## 2014-03-14 MED ORDER — AMOXICILLIN-POT CLAVULANATE 875-125 MG PO TABS: 1.0000 | ORAL_TABLET | Freq: Two times a day (BID) | ORAL | Status: DC

## 2014-03-14 NOTE — ED Provider Notes (Signed)
CSN: [id number]     Arrival date & time 03/13/14  2125 History   First MD Initiated Contact with Patient 03/14/14 0028     Chief Complaint  Patient presents with  . Emesis  . Rash  . Nasal Congestion    (Consider location/radiation/quality/duration/timing/severity/associated sxs/prior Treatment) HPI Comments: Patient is a 33 year old male with a history of sarcoidosis who presents to the emergency department with multiple complaints. Patient states that he has been having sinus pressure and nasal congestion 2 weeks. He states that he has been taking NyQuil and Sudafed for symptoms with no significant improvement. He states that symptoms have been waxing and waning in severity since onset. Symptoms became associated with nausea over the last 24 hours. He has had 2 episodes of NB/NB emesis today, PTA. Patient denies associated fever, sore throat or inability to swallow, cough, shortness of breath, abdominal pain, and dysuria or hematuria. He states that he is supposed to be taking prednisone for his sarcoidosis, but discontinued this abruptly one month ago. Patient states that he was not happy that he was gaining weight from the use of chronic prednisone. Since discontinuation of steroids, patient has noticed a rash developing on his upper back and chest as well as the dorsal aspect of his R foot. Patient has not tried anything for symptoms. He denies the use of new soaps/detergents or new food ingestions. No recent abx use. No contact with persons with similar rash.  The history is provided by the patient. No language interpreter was used.    Past Medical History  Diagnosis Date  . Sarcoidosis   . Sarcoidosis    Past Surgical History  Procedure Laterality Date  . Lymph node biopsy     History reviewed. No pertinent family history. History  Substance Use Topics  . Smoking status: Current Every Day Smoker -- 0.50 packs/day for 2 years    Types: Cigarettes  . Smokeless tobacco: Not on  file  . Alcohol Use: 0.6 oz/week    1 Cans of beer per week     Comment: occasionally    Review of Systems  Constitutional: Positive for fatigue. Negative for fever.  HENT: Positive for congestion, rhinorrhea and sinus pressure. Negative for drooling and trouble swallowing.   Respiratory: Negative for chest tightness and shortness of breath.   Gastrointestinal: Positive for nausea and vomiting. Negative for abdominal pain.  Genitourinary: Negative for dysuria and hematuria.  Skin: Positive for rash.  Neurological: Negative for syncope.  All other systems reviewed and are negative.   Allergies  Review of patient's allergies indicates no known allergies.  Home Medications   Prior to Admission medications   Medication Sig Start Date End Date Taking? Authorizing Provider  amoxicillin-clavulanate (AUGMENTIN) 875-125 MG per tablet Take 1 tablet by mouth every 12 (twelve) hours. 03/14/14   Antonietta Breach, PA-C  ondansetron (ZOFRAN) 4 MG tablet Take 1 tablet (4 mg total) by mouth every 6 (six) hours. 03/14/14   Antonietta Breach, PA-C  predniSONE (DELTASONE) 20 MG tablet Take 1.5 tablets (30 mg total) by mouth daily with breakfast. 03/14/14   Antonietta Breach, PA-C  selenium sulfide (SELSUN) 1 % LOTN Apply 1 application topically daily. Apply to rash on back and chest 03/14/14   Antonietta Breach, PA-C   BP 123/61  Pulse 59  Temp(Src) 98.5 F (36.9 C)  Resp 18  Ht 6' (1.829 m)  Wt 220 lb (99.791 kg)  BMI 29.83 kg/m2  SpO2 100%  Physical Exam  Nursing note and vitals  reviewed. Constitutional: He is oriented to person, place, and time. He appears well-developed and well-nourished. No distress.  Nontoxic/nonseptic appearing  HENT:  Head: Normocephalic and atraumatic.  Right Ear: External ear normal.  Left Ear: External ear normal.  Mouth/Throat: Uvula is midline, oropharynx is clear and moist and mucous membranes are normal.  Audible nasal congestion. Oropharynx clear. Patient tolerating secretions  without difficulty.  Eyes: Conjunctivae and EOM are normal. No scleral icterus.  Neck: Normal range of motion. Neck supple.  Cardiovascular: Normal rate, regular rhythm and intact distal pulses.   Pulmonary/Chest: Effort normal and breath sounds normal. No respiratory distress. He has no wheezes. He has no rales.  Chest expansion symmetric.  Abdominal: Soft. He exhibits no distension. There is no rebound and no guarding.  Patient without focal tenderness on abdominal exam. No masses or peritoneal signs. Abdomen soft.  Musculoskeletal: Normal range of motion.  Neurological: He is alert and oriented to person, place, and time. He exhibits normal muscle tone. Coordination normal.  GCS 15. Patient moving all extremities. He ambulates with normal gait.  Skin: Skin is warm and dry. Rash noted. He is not diaphoretic. No erythema. No pallor.  Planar, nonpruritic, macules to chest and upper back; color is lightened compared to surrounding skin. Dorsal aspect of foot with dry, maculopapular, mildly erythematous rash with mild skin peeling; negative Nikolsky's sign. Patient also noted to have raise, rough, dry-appearing plaques to L arm and lateral to R nare. Patient states plaques have developed since d/c of prednisone.  Psychiatric: He has a normal mood and affect. His behavior is normal.    ED Course  Procedures (including critical care time) Labs Review Labs Reviewed  CBC WITH DIFFERENTIAL - Abnormal; Notable for the following:    WBC 3.7 (*)    Platelets 137 (*)    Monocytes Relative 15 (*)    Eosinophils Relative 8 (*)    All other components within normal limits  COMPREHENSIVE METABOLIC PANEL - Abnormal; Notable for the following:    Creatinine, Ser 1.37 (*)    AST 52 (*)    GFR calc non Af Amer 66 (*)    GFR calc Af Amer 76 (*)    All other components within normal limits    Imaging Review No results found.   EKG Interpretation None      MDM   Final diagnoses:  Acute  sinusitis, recurrence not specified, unspecified location  Nausea  Tinea versicolor    33 year old male with history of sarcoidosis presents to the emergency department for multiple complaints. Patient has a primary complaint of nasal congestion and sinus pressure, waxing and waning 2 weeks. Symptoms became associated with nausea and 2 episodes of emesis prior to arrival. Patient is nontoxic and nonseptic appearing without clinical signs of dehydration. Abdomen is soft without focal tenderness or peritoneal signs. Suspect that nausea and emesis are secondary to postnasal drip associated with acute sinusitis. Given the fact that patient was previously immunosuppressed with prednisone for his sarcoidosis and has been experiencing symptoms for 2 weeks, we will manage sinusitis with outpatient course of Augmentin.  Patient also complaining of a rash to his upper back which is new. Physical exam findings consistent with tinea versicolor. Patient also has evidence of tinea pedis to his right foot. A rash characterized as raised plaques appreciated as well. These appear consistent with cutaneous manifestations of patient's known sarcoidosis. He states that plaques only appeared after he discontinued his prednisone. He endorses a history of similar plaques  associated with his sarcoidosis.  Patient to be discharged with prescription for selenium sulfide lotion as well as Zofran for nausea. Have advised patient to restart his prednisone and discuss management of his sarcoidosis with his rheumatologist. Return precautions discussed and provided. Patient agreeable to plan with no unaddressed concerns. Patient discharged in good condition; VSS.   Filed Vitals:   03/13/14 2149 03/14/14 0026 03/14/14 0045 03/14/14 0130  BP: 137/83 129/63 135/59 123/61  Pulse: 89 61 59 59  Temp: 98.5 F (36.9 C)     Resp: 18 16 16 18   Height: 6' (1.829 m)     Weight: 220 lb (99.791 kg)     SpO2: 95% 100% 100% 100%      Antonietta Breach, PA-C 03/14/14 1958

## 2014-03-14 NOTE — Discharge Instructions (Signed)
Sinusitis Sinusitis is redness, soreness, and inflammation of the paranasal sinuses. Paranasal sinuses are air pockets within the bones of your face (beneath the eyes, the middle of the forehead, or above the eyes). In healthy paranasal sinuses, mucus is able to drain out, and air is able to circulate through them by way of your nose. However, when your paranasal sinuses are inflamed, mucus and air can become trapped. This can allow bacteria and other germs to grow and cause infection. Sinusitis can develop quickly and last only a short time (acute) or continue over a long period (chronic). Sinusitis that lasts for more than 12 weeks is considered chronic.  CAUSES  Causes of sinusitis include:  Allergies.  Structural abnormalities, such as displacement of the cartilage that separates your nostrils (deviated septum), which can decrease the air flow through your nose and sinuses and affect sinus drainage.  Functional abnormalities, such as when the small hairs (cilia) that line your sinuses and help remove mucus do not work properly or are not present. SIGNS AND SYMPTOMS  Symptoms of acute and chronic sinusitis are the same. The primary symptoms are pain and pressure around the affected sinuses. Other symptoms include:  Upper toothache.  Earache.  Headache.  Bad breath.  Decreased sense of smell and taste.  A cough, which worsens when you are lying flat.  Fatigue.  Fever.  Thick drainage from your nose, which often is green and may contain pus (purulent).  Swelling and warmth over the affected sinuses. DIAGNOSIS  Your health care provider will perform a physical exam. During the exam, your health care provider may:  Look in your nose for signs of abnormal growths in your nostrils (nasal polyps).  Tap over the affected sinus to check for signs of infection.  View the inside of your sinuses (endoscopy) using an imaging device that has a light attached (endoscope). If your health  care provider suspects that you have chronic sinusitis, one or more of the following tests may be recommended:  Allergy tests.  Nasal culture. A sample of mucus is taken from your nose, sent to a lab, and screened for bacteria.  Nasal cytology. A sample of mucus is taken from your nose and examined by your health care provider to determine if your sinusitis is related to an allergy. TREATMENT  Most cases of acute sinusitis are related to a viral infection and will resolve on their own within 10 days. Sometimes medicines are prescribed to help relieve symptoms (pain medicine, decongestants, nasal steroid sprays, or saline sprays).  However, for sinusitis related to a bacterial infection, your health care provider will prescribe antibiotic medicines. These are medicines that will help kill the bacteria causing the infection.  Rarely, sinusitis is caused by a fungal infection. In theses cases, your health care provider will prescribe antifungal medicine. For some cases of chronic sinusitis, surgery is needed. Generally, these are cases in which sinusitis recurs more than 3 times per year, despite other treatments. HOME CARE INSTRUCTIONS   Drink plenty of water. Water helps thin the mucus so your sinuses can drain more easily.  Use a humidifier.  Inhale steam 3 to 4 times a day (for example, sit in the bathroom with the shower running).  Apply a warm, moist washcloth to your face 3 to 4 times a day, or as directed by your health care provider.  Use saline nasal sprays to help moisten and clean your sinuses.  Take medicines only as directed by your health care provider.    If you were prescribed either an antibiotic or antifungal medicine, finish it all even if you start to feel better. SEEK IMMEDIATE MEDICAL CARE IF:  You have increasing pain or severe headaches.  You have nausea, vomiting, or drowsiness.  You have swelling around your face.  You have vision problems.  You have a stiff  neck.  You have difficulty breathing. MAKE SURE YOU:   Understand these instructions.  Will watch your condition.  Will get help right away if you are not doing well or get worse. Document Released: 05/07/2005 Document Revised: 09/21/2013 Document Reviewed: 05/22/2011 [facility] Patient Information 2015 [place], [place]. This information is not intended to replace advice given to you by your health care provider. Make sure you discuss any questions you have with your health care provider.  Tinea Versicolor Tinea versicolor is a common yeast infection of the skin. This condition becomes known when the yeast on our skin starts to overgrow (yeast is a normal inhabitant on our skin). This condition is noticed as white or light brown patches on brown skin, and is more evident in the summer on tanned skin. These areas are slightly scaly if scratched. The light patches from the yeast become evident when the yeast creates "holes in your suntan". This is most often noticed in the summer. The patches are usually located on the chest, back, pubis, neck and body folds. However, it may occur on any area of body. Mild itching and inflammation (redness or soreness) may be present. DIAGNOSIS  The diagnosisof this is made clinically (by looking). Cultures from samples are usually not needed. Examination under the microscope may help. However, yeast is normally found on skin. The diagnosis still remains clinical. Examination under Wood's Ultraviolet Light can determine the extent of the infection. TREATMENT  This common infection is usually only of cosmetic (only a concern to your appearance). It is easily treated with dandruff shampoo used during showers or bathing. Vigorous scrubbing will eliminate the yeast over several days time. The light areas in your skin may remain for weeks or months after the infection is cured unless your skin is exposed to sunlight. The lighter or darker spots caused by the fungus that  remain after complete treatment are not a sign of treatment failure; it will take a long time to resolve. Your caregiver may recommend a number of commercial preparations or medication by mouth if home care is not working. Recurrence is common and preventative medication may be necessary. This skin condition is not highly contagious. Special care is not needed to protect close friends and family members. Normal hygiene is usually enough. Follow up is required only if you develop complications (such as a secondary infection from scratching), if recommended by your caregiver, or if no relief is obtained from the preparations used. Document Released: 05/04/2000 Document Revised: 07/30/2011 Document Reviewed: 06/16/2008 [facility] Patient Information 2015 [place], [place]. This information is not intended to replace advice given to you by your health care provider. Make sure you discuss any questions you have with your health care provider.

## 2014-03-15 ENCOUNTER — Telehealth (HOSPITAL_BASED_OUTPATIENT_CLINIC_OR_DEPARTMENT_OTHER): Payer: Self-pay | Admitting: Emergency Medicine

## 2014-03-17 NOTE — ED Provider Notes (Signed)
  Medical screening examination/treatment/procedure(s) were performed by non-physician practitioner and as supervising physician I was immediately available for consultation/collaboration.   EKG Interpretation None         Carmin Muskrat, MD 03/17/14 0009

## 2014-06-08 ENCOUNTER — Emergency Department (HOSPITAL_COMMUNITY)
Admission: EM | Admit: 2014-06-08 | Discharge: 2014-06-08 | Disposition: A | Discharge status: Home / Self Care | Payer: No Typology Code available for payment source | Attending: Emergency Medicine | Admitting: Emergency Medicine

## 2014-06-08 ENCOUNTER — Encounter (HOSPITAL_COMMUNITY): Payer: Self-pay | Admitting: Emergency Medicine

## 2014-06-08 DIAGNOSIS — Z792 Long term (current) use of antibiotics: Secondary | ICD-10-CM | POA: Insufficient documentation

## 2014-06-08 DIAGNOSIS — Z79899 Other long term (current) drug therapy: Secondary | ICD-10-CM | POA: Insufficient documentation

## 2014-06-08 DIAGNOSIS — D869 Sarcoidosis, unspecified: Secondary | ICD-10-CM | POA: Insufficient documentation

## 2014-06-08 DIAGNOSIS — Z760 Encounter for issue of repeat prescription: Secondary | ICD-10-CM | POA: Insufficient documentation

## 2014-06-08 DIAGNOSIS — Z7952 Long term (current) use of systemic steroids: Secondary | ICD-10-CM | POA: Insufficient documentation

## 2014-06-08 DIAGNOSIS — Z720 Tobacco use: Secondary | ICD-10-CM | POA: Insufficient documentation

## 2014-06-08 MED ORDER — PREDNISONE 20 MG PO TABS: 30.0000 mg | ORAL_TABLET | Freq: Every day | ORAL | Status: DC

## 2014-06-08 MED ORDER — PREDNISONE 20 MG PO TABS
30.0000 mg | ORAL_TABLET | Freq: Once | ORAL | Status: AC
  Administered 2014-06-08: 30 mg via ORAL
  Filled 2014-06-08: qty 2

## 2014-06-08 NOTE — ED Provider Notes (Signed)
CSN: [id number]     Arrival date & time 06/08/14  0800 History   First MD Initiated Contact with Patient 06/08/14 [phone]     Chief Complaint  Patient presents with  . Medication Refill     (Consider location/radiation/quality/duration/timing/severity/associated sxs/prior Treatment) HPI Comments: Patient is a 35 year old male with a past medical history of sarcoidosis who presents for a medication refill. Patient reports taking 30mg  prednisone per day and recently ran out. He states when he runs out he "can feel it in his skin." Patient does have a doctor and has a follow up appointment later this week. No aggravating/alleviating factors. No other associated symptoms.    Past Medical History  Diagnosis Date  . Sarcoidosis   . Sarcoidosis    Past Surgical History  Procedure Laterality Date  . Lymph node biopsy     No family history on file. History  Substance Use Topics  . Smoking status: Current Every Day Smoker -- 0.50 packs/day for 2 years    Types: Cigarettes  . Smokeless tobacco: Not on file  . Alcohol Use: 0.6 oz/week    1 Cans of beer per week     Comment: occasionally    Review of Systems  Constitutional: Negative for fever, chills and fatigue.       Medication refill  HENT: Negative for trouble swallowing.   Eyes: Negative for visual disturbance.  Respiratory: Negative for shortness of breath.   Cardiovascular: Negative for chest pain and palpitations.  Gastrointestinal: Negative for nausea, vomiting, abdominal pain and diarrhea.  Genitourinary: Negative for dysuria and difficulty urinating.  Musculoskeletal: Negative for arthralgias and neck pain.  Skin: Negative for color change.  Neurological: Negative for dizziness and weakness.  Psychiatric/Behavioral: Negative for dysphoric mood.      Allergies  Review of patient's allergies indicates no known allergies.  Home Medications   Prior to Admission medications   Medication Sig Start Date End Date Taking?  Authorizing Provider  amoxicillin-clavulanate (AUGMENTIN) 875-125 MG per tablet Take 1 tablet by mouth every 12 (twelve) hours. 03/14/14   Antonietta Breach, PA-C  ondansetron (ZOFRAN) 4 MG tablet Take 1 tablet (4 mg total) by mouth every 6 (six) hours. 03/14/14   Antonietta Breach, PA-C  predniSONE (DELTASONE) 20 MG tablet Take 1.5 tablets (30 mg total) by mouth daily with breakfast. 03/14/14   Antonietta Breach, PA-C  selenium sulfide (SELSUN) 1 % LOTN Apply 1 application topically daily. Apply to rash on back and chest 03/14/14   Antonietta Breach, PA-C   BP 118/86 mmHg  Pulse 83  Temp(Src) 97.9 F (36.6 C) (Oral)  Resp 20  SpO2 98% Physical Exam  Constitutional: He is oriented to person, place, and time. He appears well-developed and well-nourished. No distress.  HENT:  Head: Normocephalic and atraumatic.  Eyes: Conjunctivae and EOM are normal.  Neck: Normal range of motion.  Cardiovascular: Normal rate and regular rhythm.  Exam reveals no gallop and no friction rub.   No murmur heard. Pulmonary/Chest: Effort normal and breath sounds normal. He has no wheezes. He has no rales. He exhibits no tenderness.  Abdominal: Soft. He exhibits no distension. There is no tenderness.  Musculoskeletal: Normal range of motion.  Neurological: He is alert and oriented to person, place, and time. Coordination normal.  Speech is goal-oriented. Moves limbs without ataxia.   Skin: Skin is warm and dry.  Scattered dry patches on bilateral arms.   Psychiatric: He has a normal mood and affect. His behavior is normal.  Nursing note and vitals reviewed.   ED Course  Procedures (including critical care time) Labs Review Labs Reviewed - No data to display  Imaging Review No results found.   EKG Interpretation None      MDM   Final diagnoses:  Medication refill    8:25 AM Patient will have refill of prednisone. Vitals stable and patient afebrile. Patient is following up with his doctor this week.    Alvina Chou, PA-C 06/08/14 0831  Leota Jacobsen, MD 06/08/14 0900

## 2014-06-08 NOTE — ED Notes (Signed)
Patient denies any problems at this time.   Patient states he just ran out of prednisone.

## 2014-06-08 NOTE — ED Notes (Signed)
Patient states he need prescription for prednisone for his sarcoidosis.   Patient states he did not call his doctor in [place].

## 2014-08-19 ENCOUNTER — Encounter (HOSPITAL_COMMUNITY): Payer: Self-pay | Admitting: Emergency Medicine

## 2014-08-19 ENCOUNTER — Emergency Department (HOSPITAL_COMMUNITY)
Admission: EM | Admit: 2014-08-19 | Discharge: 2014-08-19 | Disposition: A | Discharge status: Home / Self Care | Payer: Self-pay | Attending: Emergency Medicine | Admitting: Emergency Medicine

## 2014-08-19 DIAGNOSIS — Z792 Long term (current) use of antibiotics: Secondary | ICD-10-CM | POA: Insufficient documentation

## 2014-08-19 DIAGNOSIS — Z7952 Long term (current) use of systemic steroids: Secondary | ICD-10-CM | POA: Insufficient documentation

## 2014-08-19 DIAGNOSIS — Z862 Personal history of diseases of the blood and blood-forming organs and certain disorders involving the immune mechanism: Secondary | ICD-10-CM | POA: Insufficient documentation

## 2014-08-19 DIAGNOSIS — Y9289 Other specified places as the place of occurrence of the external cause: Secondary | ICD-10-CM | POA: Insufficient documentation

## 2014-08-19 DIAGNOSIS — Z79899 Other long term (current) drug therapy: Secondary | ICD-10-CM | POA: Insufficient documentation

## 2014-08-19 DIAGNOSIS — W51XXXA Accidental striking against or bumped into by another person, initial encounter: Secondary | ICD-10-CM | POA: Insufficient documentation

## 2014-08-19 DIAGNOSIS — Z720 Tobacco use: Secondary | ICD-10-CM | POA: Insufficient documentation

## 2014-08-19 DIAGNOSIS — Y9389 Activity, other specified: Secondary | ICD-10-CM | POA: Insufficient documentation

## 2014-08-19 DIAGNOSIS — Y998 Other external cause status: Secondary | ICD-10-CM | POA: Insufficient documentation

## 2014-08-19 DIAGNOSIS — S0502XA Injury of conjunctiva and corneal abrasion without foreign body, left eye, initial encounter: Secondary | ICD-10-CM | POA: Insufficient documentation

## 2014-08-19 MED ORDER — POLYMYXIN B-TRIMETHOPRIM 10000-0.1 UNIT/ML-% OP SOLN: 1.0000 [drp] | OPHTHALMIC | Status: DC

## 2014-08-19 MED ORDER — TETRACAINE HCL 0.5 % OP SOLN
1.0000 [drp] | Freq: Once | OPHTHALMIC | Status: DC
  Filled 2014-08-19: qty 2

## 2014-08-19 MED ORDER — FLUORESCEIN SODIUM 1 MG OP STRP
1.0000 | ORAL_STRIP | Freq: Once | OPHTHALMIC | Status: DC
  Filled 2014-08-19: qty 1

## 2014-08-19 NOTE — ED Notes (Signed)
Patient states son scratched L eye yesterday.   Patient states he has had redness and pain since.   Denies other symptoms.

## 2014-08-19 NOTE — Discharge Instructions (Signed)
Use antibiotic eyedrops as directed until symptoms resolve. Refer to attached documents for more information.

## 2014-08-19 NOTE — ED Provider Notes (Signed)
CSN: [id number]     Arrival date & time 08/19/14  1025 History  This chart was scribed for non-physician practitioner, Alvina Chou, PA-C, working with Noemi Chapel, MD, by Chester Holstein, ED Scribe. This patient was seen in room TR05C/TR05C and the patient's care was started at 11:00 AM.    Chief Complaint  Patient presents with  . Eye Pain   The history is provided by the patient. No language interpreter was used.   HPI Comments: Nathan Flores is a 34 y.o. male who presents to the Emergency Department complaining of burning left eye pain with onset yesterday. Pt notes his son scratched his eye at onset. Pt notes associated redness, eye discharge, and headache. Pt states his eye was closed shut this morning. Pt was seen by PCP and given ibuprofen and Advil. Pt denies known foreign body, eye itching, and any other medical complaints.   Past Medical History  Diagnosis Date  . Sarcoidosis   . Sarcoidosis    Past Surgical History  Procedure Laterality Date  . Lymph node biopsy     No family history on file. History  Substance Use Topics  . Smoking status: Current Every Day Smoker -- 0.50 packs/day for 2 years    Types: Cigarettes  . Smokeless tobacco: Not on file  . Alcohol Use: 0.6 oz/week    1 Cans of beer per week     Comment: occasionally    Review of Systems  Eyes: Positive for discharge and redness. Negative for itching.  Neurological: Positive for headaches.  All other systems reviewed and are negative.     Allergies  Review of patient's allergies indicates no known allergies.  Home Medications   Prior to Admission medications   Medication Sig Start Date End Date Taking? Authorizing Provider  amoxicillin-clavulanate (AUGMENTIN) 875-125 MG per tablet Take 1 tablet by mouth every 12 (twelve) hours. 03/14/14   Antonietta Breach, PA-C  ondansetron (ZOFRAN) 4 MG tablet Take 1 tablet (4 mg total) by mouth every 6 (six) hours. 03/14/14   Antonietta Breach, PA-C  predniSONE  (DELTASONE) 20 MG tablet Take 1.5 tablets (30 mg total) by mouth daily. 06/08/14   Zenda Herskowitz, PA-C  selenium sulfide (SELSUN) 1 % LOTN Apply 1 application topically daily. Apply to rash on back and chest 03/14/14   Antonietta Breach, PA-C   BP 126/46 mmHg  Pulse 82  Resp 18  SpO2 99% Physical Exam  Constitutional: He is oriented to person, place, and time. He appears well-developed and well-nourished. No distress.  HENT:  Head: Normocephalic and atraumatic.  Eyes: EOM are normal. Left conjunctiva is injected. Left conjunctiva has a hemorrhage.  Neck: Normal range of motion. Neck supple.  Cardiovascular: Normal rate and regular rhythm.  Exam reveals no gallop and no friction rub.   No murmur heard. Pulmonary/Chest: Effort normal and breath sounds normal. He has no wheezes. He has no rales. He exhibits no tenderness.  Abdominal: Soft. There is no tenderness.  Musculoskeletal: Normal range of motion.  Neurological: He is alert and oriented to person, place, and time.  Speech is goal-oriented. Moves limbs without ataxia.   Skin: Skin is warm and dry.  Psychiatric: He has a normal mood and affect. His behavior is normal.  Nursing note and vitals reviewed.   ED Course  Procedures (including critical care time) DIAGNOSTIC STUDIES: Oxygen Saturation is 99% on room air, normal by my interpretation.    COORDINATION OF CARE: 11:06 AM Discussed treatment plan with patient at beside,  the patient agrees with the plan and has no further questions at this time.   Labs Review Labs Reviewed - No data to display  Imaging Review No results found.   EKG Interpretation None      MDM   Final diagnoses:  Corneal abrasion, left, initial encounter   Patient declined fluorescin dye exam. Patient will be discharged with polytrim eye drops for suspected corneal abrasion.   I personally performed the services described in this documentation, which was scribed in my presence. The recorded  information has been reviewed and is accurate.      Alvina Chou, PA-C 08/19/14 1500  Noemi Chapel, MD 08/19/14 [phone]

## 2015-07-02 ENCOUNTER — Emergency Department (INDEPENDENT_AMBULATORY_CARE_PROVIDER_SITE_OTHER)
Admission: EM | Admit: 2015-07-02 | Discharge: 2015-07-02 | Disposition: A | Discharge status: Home / Self Care | Payer: BLUE CROSS/BLUE SHIELD | Source: Home / Self Care | Attending: Family Medicine | Admitting: Family Medicine

## 2015-07-02 ENCOUNTER — Encounter (HOSPITAL_COMMUNITY): Payer: Self-pay | Admitting: Emergency Medicine

## 2015-07-02 ENCOUNTER — Emergency Department (HOSPITAL_COMMUNITY)
Admission: EM | Admit: 2015-07-02 | Discharge: 2015-07-02 | Disposition: A | Discharge status: Home / Self Care | Payer: Self-pay | Source: Home / Self Care

## 2015-07-02 ENCOUNTER — Emergency Department (HOSPITAL_COMMUNITY)
Admission: EM | Admit: 2015-07-02 | Discharge: 2015-07-02 | Disposition: A | Discharge status: Home / Self Care | Payer: No Typology Code available for payment source

## 2015-07-02 DIAGNOSIS — S60221A Contusion of right hand, initial encounter: Secondary | ICD-10-CM | POA: Diagnosis not present

## 2015-07-02 MED ORDER — POVIDONE-IODINE 10 % EX SOLN
CUTANEOUS | Status: AC
  Filled 2015-07-02: qty 118

## 2015-07-02 NOTE — ED Notes (Signed)
Patient going to [facility] for stitches.

## 2015-07-02 NOTE — ED Notes (Signed)
Patient placed in Betadine and warm water mixture and soaked for 15 mins.

## 2015-07-02 NOTE — Discharge Instructions (Signed)
It was a pleasure to see you today.    As we discussed, you may present to the emergency room at [facility] tomorrow morning for evacuation of the hematoma (blood collection under skin).   Keep the area clean and dry.    Note for work tomorrow.

## 2015-07-02 NOTE — ED Notes (Signed)
Patient c/o right posterior hand laceration about an hour ago. Patient reports he was injured by a kitchen knife. Patient is still able to grip and denies numbness.

## 2015-07-02 NOTE — ED Provider Notes (Addendum)
CSN: [id number]     Arrival date & time 07/02/15  1940 History   First MD Initiated Contact with Patient 07/02/15 2026     No chief complaint on file.  (Consider location/radiation/quality/duration/timing/severity/associated sxs/prior Treatment) The history is provided by the patient. No language interpreter was used.   Patient presents to [facility] for injury involving a steak knife that slipped from his hands and entered the dorsum of his hand between index finger and thumb (first interosseous web space). Occurred about 7pm this evening. Patient wrapped in a shirt and drove himself to the ED.  Upon finding the ED to be busy, he came to the urgent care center for evaluation.   Reports being in his otherwise usual state of health.  Reports receiving tetanus vaccine in the past 2 months for minor injury.  Past Medical History  Diagnosis Date  . Sarcoidosis   . Sarcoidosis    Past Surgical History  Procedure Laterality Date  . Lymph node biopsy     No family history on file. Social History  Substance Use Topics  . Smoking status: Current Every Day Smoker -- 0.50 packs/day for 2 years    Types: Cigarettes  . Smokeless tobacco: Not on file  . Alcohol Use: 0.6 oz/week    1 Cans of beer per week     Comment: occasionally    Review of Systems  Constitutional: Negative for fever and chills.  All other systems reviewed and are negative.   Allergies  Review of patient's allergies indicates no known allergies.  Home Medications   Prior to Admission medications   Medication Sig Start Date End Date Taking? Authorizing Provider  amoxicillin-clavulanate (AUGMENTIN) 875-125 MG per tablet Take 1 tablet by mouth every 12 (twelve) hours. 03/14/14   Antonietta Breach, PA-C  ondansetron (ZOFRAN) 4 MG tablet Take 1 tablet (4 mg total) by mouth every 6 (six) hours. 03/14/14   Antonietta Breach, PA-C  predniSONE (DELTASONE) 20 MG tablet Take 1.5 tablets (30 mg total) by mouth daily. 06/08/14   Kaitlyn Szekalski,  PA-C  selenium sulfide (SELSUN) 1 % LOTN Apply 1 application topically daily. Apply to rash on back and chest 03/14/14   Antonietta Breach, PA-C  trimethoprim-polymyxin b (POLYTRIM) ophthalmic solution Place 1 drop into the left eye every 4 (four) hours. 08/19/14   Alvina Chou, PA-C   Meds Ordered and Administered this Visit  Medications - No data to display  BP 138/77 mmHg  Pulse 65  Temp(Src) 98 F (36.7 C) (Oral)  Resp 20  SpO2 100% No data found.   Physical Exam  Constitutional: He appears well-developed and well-nourished. No distress.  HENT:  Head: Normocephalic and atraumatic.  Neck: Neck supple.  Musculoskeletal:  RIGHT hand with laceration @1cm  length along dorsum of 1st interosseous web space.  Not bleeding at the time of exam.   Marked fluctuance and swelling around area of injury.    Brisk capillary refill in distal fingers. Palpable radial pulse right. Sensation grossly intact in fingers.  Full handgrip bilaterally.  Full wrist flexion/extension right wrist.   Neurological:  Sensation grossly intact in fingertips of RIGHT hand.  Able to flex/extend fingers of R hand. Wrist full flexion/extension R.  Skin: He is not diaphoretic.    ED Course  Procedures (including critical care time)  Labs Review Labs Reviewed - No data to display  Imaging Review No results found.   Visual Acuity Review  Right Eye Distance:   Left Eye Distance:   Bilateral Distance:  Right Eye Near:   Left Eye Near:    Bilateral Near:         MDM  No diagnosis found. Patient with injury to RIGHT hand, dorsum, first interosseous space. Hemodynamically stable. 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  Not bleeding at present time.  To cleanse, cover with sterile bandage. Patient will present to ED at [facility] in the morning; Dr. Amedeo Plenty notified of plan.   Dalbert Mayotte, MD     Willeen Niece,  MD 07/02/15 2051  Willeen Niece, MD 07/02/15 2116  Willeen Niece, MD 07/02/15 2118

## 2015-07-02 NOTE — ED Notes (Signed)
Patient removed from Betadine solution. Tolerated well. Wound covered with xerofoam and sterile dressing. Patient tolerated well.

## 2015-07-03 ENCOUNTER — Emergency Department (HOSPITAL_COMMUNITY)
Admission: EM | Admit: 2015-07-03 | Discharge: 2015-07-03 | Disposition: A | Discharge status: Home / Self Care | Payer: BLUE CROSS/BLUE SHIELD | Attending: Emergency Medicine | Admitting: Emergency Medicine

## 2015-07-03 ENCOUNTER — Encounter (HOSPITAL_COMMUNITY): Payer: Self-pay | Admitting: Emergency Medicine

## 2015-07-03 DIAGNOSIS — Z862 Personal history of diseases of the blood and blood-forming organs and certain disorders involving the immune mechanism: Secondary | ICD-10-CM | POA: Diagnosis not present

## 2015-07-03 DIAGNOSIS — Z79899 Other long term (current) drug therapy: Secondary | ICD-10-CM | POA: Insufficient documentation

## 2015-07-03 DIAGNOSIS — Z7952 Long term (current) use of systemic steroids: Secondary | ICD-10-CM | POA: Insufficient documentation

## 2015-07-03 DIAGNOSIS — S60221D Contusion of right hand, subsequent encounter: Secondary | ICD-10-CM

## 2015-07-03 DIAGNOSIS — Z792 Long term (current) use of antibiotics: Secondary | ICD-10-CM | POA: Insufficient documentation

## 2015-07-03 DIAGNOSIS — IMO0002 Reserved for concepts with insufficient information to code with codable children: Secondary | ICD-10-CM

## 2015-07-03 DIAGNOSIS — F17210 Nicotine dependence, cigarettes, uncomplicated: Secondary | ICD-10-CM | POA: Diagnosis not present

## 2015-07-03 DIAGNOSIS — S61411D Laceration without foreign body of right hand, subsequent encounter: Secondary | ICD-10-CM | POA: Diagnosis not present

## 2015-07-03 NOTE — ED Provider Notes (Signed)
CSN: [id number]     Arrival date & time 07/03/15  41 History   First MD Initiated Contact with Patient 07/03/15 1142     Chief Complaint  Patient presents with  . Laceration   (Consider location/radiation/quality/duration/timing/severity/associated sxs/prior Treatment) HPI 35 y.o. malepresents to the Emergency Department today as a follow up from UC for further evaluation of right hand laceration. Notes that a steak knife slipped from his hand yesterday and entered the dorsum of his right hand between the 1st and 2nd digit on the interosseus web space. It occurred at 7pm last night as per UC note. The doctor at [facility] recommended to be seen in ED today for evaluation by Dr. 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 Pt in no pain. No fevers. No CP/SOB/ABD pain. No N/V. No other symptoms.    Past Medical History  Diagnosis Date  . Sarcoidosis ([place])   . Sarcoidosis [facility])    Past Surgical History  Procedure Laterality Date  . Lymph node biopsy     No family history on file. Social History  Substance Use Topics  . Smoking status: Current Every Day Smoker -- 0.50 packs/day for 2 years    Types: Cigarettes  . Smokeless tobacco: None  . Alcohol Use: 0.6 oz/week    1 Cans of beer per week     Comment: occasionally    Review of Systems ROS reviewed and all are negative for acute change except as noted in the HPI.  Allergies  Review of patient's allergies indicates no known allergies.  Home Medications   Prior to Admission medications   Medication Sig Start Date End Date Taking? Authorizing Provider  amoxicillin-clavulanate (AUGMENTIN) 875-125 MG per tablet Take 1 tablet by mouth every 12 (twelve) hours. 03/14/14   Antonietta Breach, PA-C  ondansetron (ZOFRAN) 4 MG tablet Take 1 tablet (4 mg total) by mouth every 6 (six) hours. 03/14/14   Antonietta Breach, PA-C  predniSONE (DELTASONE) 20 MG tablet Take 1.5 tablets (30 mg total) by mouth daily. 06/08/14   Kaitlyn  Szekalski, PA-C  selenium sulfide (SELSUN) 1 % LOTN Apply 1 application topically daily. Apply to rash on back and chest 03/14/14   Antonietta Breach, PA-C  trimethoprim-polymyxin b (POLYTRIM) ophthalmic solution Place 1 drop into the left eye every 4 (four) hours. 08/19/14   Kaitlyn Szekalski, PA-C   BP 103/61 mmHg  Pulse 82  Temp(Src) 98 F (36.7 C) (Oral)  Resp 20  SpO2 96%   Physical Exam  Constitutional: He is oriented to person, place, and time. He appears well-developed and well-nourished.  HENT:  Head: Normocephalic and atraumatic.  Eyes: EOM are normal.  Cardiovascular: Normal rate and regular rhythm.   Pulmonary/Chest: Effort normal.  Abdominal: Soft.  Musculoskeletal: Normal range of motion.  Laceration 1 cm length on dorsum of right hand at 1st interosseus web space. Not actively bleeding. Good ROM. Cap refill <2sec. Distal pulses intact. Sensation intact. Full wrist flexion/extension.   Neurological: He is alert and oriented to person, place, and time.  Skin: Skin is warm and dry.  Psychiatric: He has a normal mood and affect. His behavior is normal. Thought content normal.  Nursing note and vitals reviewed.  ED Course  Procedures (including critical care time) Labs Review Labs Reviewed - No data to display  Imaging Review No results found. I have personally reviewed and evaluated these images and lab results as part of my medical decision-making.   EKG Interpretation None  MDM  I have reviewed the relevant previous healthcare records. I obtained HPI from historian. Patient discussed with supervising physician  ED Course:  Assessment: 9y M seen yesterday at [facility] for laceration to dorsum of left hand between 1st and 2nd digit of and first interosseus web space. Provider yesterday contacted Dr. Amedeo Plenty and requested to have patient in ED today. Possible injury to Pollicis Artery due to associated hematoma. On exam, no bleeding presently. ROM intact. Motor/sensory  intact. Cap refill <2sec. Pulses intact. On consult with Dr. Amedeo Plenty, recommended conservative management. Ortho states no indication of arterial injury. Will DC patient with follow up to Dr. Amedeo Plenty for further treatment   Disposition/Plan:  DC home Additional Verbal discharge instructions given and discussed with patient.  Pt Instructed to f/u with Dr. Amedeo Plenty Return precautions given Pt acknowledges and agrees with plan  Supervising Physician Quintella Reichert, MD    Final diagnoses:  Traumatic hematoma of right hand, subsequent encounter  Laceration      Shary Decamp, PA-C 07/03/15 [place], MD 07/11/15 1452

## 2015-07-03 NOTE — Discharge Instructions (Signed)
Please read and follow all provided instructions.  Your diagnoses today include:  1. Traumatic hematoma of right hand, subsequent encounter   2. Laceration    Tests performed today include:  X-ray of the affected area that did not show any foreign bodies or broken bones  Vital signs. See below for your results today.   Medications prescribed:   Take any prescribed medications only as directed.   Home care instructions:  Follow any educational materials and wound care instructions contained in this packet.   You may shower and wash the area with soap and water, just be sure to pat the area dry and not rub over the stitches. Do no put your stiches underwater (in a bath, pool, or lake). Getting stiches wet can slow down healing and increase your chances of getting an infection. You may apply Bacitracin or Neosporin twice a day for 7 days, and keep the ara clean with  bandage or gauze. Do not apply alcohol or hydrogen peroxide. Cover the area if it draining or weeping.   Follow-up instructions: Follow up with Dr. Amedeo Plenty in the office within 2 weeks   Return instructions:  Return to the Emergency Department if you have:  Fever  Worsening pain  Worsening swelling of the wound  Pus draining from the wound  Redness of the skin that moves away from the wound, especially if it streaks away from the affected area   Any other emergent concerns  Your vital signs today were: BP 103/61 mmHg   Pulse 82   Temp(Src) 98 F (36.7 C) (Oral)   Resp 20   SpO2 96% If your blood pressure (BP) was elevated above 135/85 this visit, please have this repeated by your doctor within one month. --------------

## 2015-07-03 NOTE — Consult Note (Signed)
Reason for Consult: Right hand laceration thenar space Referring Physician: ER department  Nathan Flores is an 35 y.o. male.  HPI: There is usual male with a history of sarcoidosis presents with a accidental knife injury to the first web space right hand. He was seen at the urgent care last night it was felt that he had an expanding hematoma in that he should be evaluated today after hemostasis is secured.  I reviewed the patient's exam and all issues.  He states he has some pain. He does have a 1-1/2 cm wound over the first webspace. There is no evidence of infection dystrophy no evidence of vascular compromise  He is sensate has normal motion to the fingers. The hematoma is not unreasonable in terms of the swelling  Past Medical History  Diagnosis Date  . Sarcoidosis ([place])   . Sarcoidosis [facility])     Past Surgical History  Procedure Laterality Date  . Lymph node biopsy      No family history on file.  Social History:  reports that he has been smoking Cigarettes.  He has a 1 pack-year smoking history. He does not have any smokeless tobacco history on file. He reports that he drinks about 0.6 oz of alcohol per week. He reports that he does not use illicit drugs.  Allergies: No Known Allergies  Medications: I have reviewed the patient's current medications.  No results found for this or any previous visit (from the past 48 hour(s)).  No results found.  Review of Systems  HENT: Negative.   Respiratory: Negative.   Cardiovascular: Negative.   Gastrointestinal: Negative.   Genitourinary: Negative.   Endo/Heme/Allergies: Negative.    Blood pressure 103/61, pulse 82, temperature 98 F (36.7 C), temperature source Oral, resp. rate 20, SpO2 96 %. Physical Exam Laceration first webspace right hand dorsal aspect. He is neurovascular intact he has no evidence of infection dystrophy compartment syndrome or vascular compromise. The patient is alert and oriented in no acute  distress. The patient complains of pain in the affected upper extremity.  The patient is noted to have a normal HEENT exam. Lung fields show equal chest expansion and no shortness of breath. Abdomen exam is nontender without distention. Lower extremity examination does not show any fracture dislocation or blood clot symptoms. Pelvis is stable and the neck and back are stable and nontender. Assessment/Plan: Right hand laceration with hematoma first webspace.  I do not feel he is injured his princeps pollicis artery. I feel he has a simple hematoma.  His wound is open and thus we performed a procedure to close wound and cleaning him.  Procedure he was given a block with lidocaine with epinephrine I scrubbed him with Betadine and irrigated the wound and closed him with 3 interrupted 5-0 chromic sutures.  He'll begin dressing changes daily. I supplied dressing change material. I discussed him elevation range of motion. I'll see him back in the office in 97-14 days pending his progress.  I've written him a light work duty note until 07/11/2015.  Shannette Tabares III,Antinette Keough M 07/03/2015, 1:50 PM

## 2015-07-03 NOTE — ED Notes (Signed)
Per pt, he cut himself yesterday, was seen at [facility] for it. They told him to see Dr. Amedeo Plenty, was told to come here to see him here. Pt has minor laceration to R hand, bleeding controlled. Pt states "im just here for stitches".

## 2015-08-13 ENCOUNTER — Encounter (HOSPITAL_COMMUNITY): Payer: Self-pay

## 2015-08-13 ENCOUNTER — Emergency Department (HOSPITAL_COMMUNITY)
Admission: EM | Admit: 2015-08-13 | Discharge: 2015-08-13 | Disposition: A | Discharge status: Home / Self Care | Payer: BLUE CROSS/BLUE SHIELD | Attending: Emergency Medicine | Admitting: Emergency Medicine

## 2015-08-13 DIAGNOSIS — Z79899 Other long term (current) drug therapy: Secondary | ICD-10-CM | POA: Insufficient documentation

## 2015-08-13 DIAGNOSIS — F17210 Nicotine dependence, cigarettes, uncomplicated: Secondary | ICD-10-CM | POA: Diagnosis not present

## 2015-08-13 DIAGNOSIS — Z7952 Long term (current) use of systemic steroids: Secondary | ICD-10-CM | POA: Diagnosis not present

## 2015-08-13 DIAGNOSIS — R197 Diarrhea, unspecified: Secondary | ICD-10-CM | POA: Insufficient documentation

## 2015-08-13 DIAGNOSIS — R112 Nausea with vomiting, unspecified: Secondary | ICD-10-CM | POA: Diagnosis present

## 2015-08-13 LAB — CBC
HCT: 45.6 % (ref 39.0–52.0)
Hemoglobin: 15.3 g/dL (ref 13.0–17.0)
MCH: 26.9 pg (ref 26.0–34.0)
MCHC: 33.6 g/dL (ref 30.0–36.0)
MCV: 80.3 fL (ref 78.0–100.0)
Platelets: 125 10*3/uL — ABNORMAL LOW (ref 150–400)
RBC: 5.68 MIL/uL (ref 4.22–5.81)
RDW: 12.6 % (ref 11.5–15.5)
WBC: 7.6 10*3/uL (ref 4.0–10.5)

## 2015-08-13 LAB — I-STAT VENOUS BLOOD GAS, ED
Bicarbonate: 19.5 mEq/L — ABNORMAL LOW (ref 20.0–24.0)
O2 SAT: 82 %
PH VEN: 7.578 — AB (ref 7.250–7.300)
TCO2: 20 mmol/L (ref 0–100)
pCO2, Ven: 20.9 mmHg — ABNORMAL LOW (ref 45.0–50.0)
pO2, Ven: 38 mmHg (ref 31.0–45.0)

## 2015-08-13 LAB — URINE MICROSCOPIC-ADD ON: BACTERIA UA: NONE SEEN

## 2015-08-13 LAB — URINALYSIS, ROUTINE W REFLEX MICROSCOPIC
Bilirubin Urine: NEGATIVE
Glucose, UA: NEGATIVE mg/dL
KETONES UR: 15 mg/dL — AB
Leukocytes, UA: NEGATIVE
NITRITE: NEGATIVE
PH: 6 (ref 5.0–8.0)
Protein, ur: NEGATIVE mg/dL
Specific Gravity, Urine: 1.013 (ref 1.005–1.030)

## 2015-08-13 LAB — COMPREHENSIVE METABOLIC PANEL
ALT: 31 U/L (ref 17–63)
AST: 35 U/L (ref 15–41)
Albumin: 4.1 g/dL (ref 3.5–5.0)
Alkaline Phosphatase: 52 U/L (ref 38–126)
Anion gap: 14 (ref 5–15)
BILIRUBIN TOTAL: 1.4 mg/dL — AB (ref 0.3–1.2)
BUN: 10 mg/dL (ref 6–20)
CO2: 19 mmol/L — ABNORMAL LOW (ref 22–32)
Calcium: 9.8 mg/dL (ref 8.9–10.3)
Chloride: 108 mmol/L (ref 101–111)
Creatinine, Ser: 1.41 mg/dL — ABNORMAL HIGH (ref 0.61–1.24)
GFR calc Af Amer: >60 mL/min (ref >60)
GFR calc non Af Amer: >60 mL/min (ref >60)
Glucose, Bld: 134 mg/dL — ABNORMAL HIGH (ref 65–99)
Potassium: 3.7 mmol/L (ref 3.5–5.1)
Sodium: 141 mmol/L (ref 135–145)
TOTAL PROTEIN: 7.1 g/dL (ref 6.5–8.1)

## 2015-08-13 LAB — LIPASE, BLOOD: LIPASE: 34 U/L (ref 11–51)

## 2015-08-13 MED ORDER — SODIUM CHLORIDE 0.9 % IV BOLUS (SEPSIS)
1500.0000 mL | Freq: Once | INTRAVENOUS | Status: AC
  Administered 2015-08-13: 1500 mL via INTRAVENOUS

## 2015-08-13 MED ORDER — ONDANSETRON HCL 4 MG/2ML IJ SOLN
4.0000 mg | INTRAMUSCULAR | Status: AC
  Administered 2015-08-13: 4 mg via INTRAVENOUS
  Filled 2015-08-13: qty 2

## 2015-08-13 MED ORDER — METHYLPREDNISOLONE SODIUM SUCC 125 MG IJ SOLR
125.0000 mg | Freq: Once | INTRAMUSCULAR | Status: AC
  Administered 2015-08-13: 125 mg via INTRAVENOUS
  Filled 2015-08-13: qty 2

## 2015-08-13 MED ORDER — ONDANSETRON 4 MG PO TBDP: 4.0000 mg | ORAL_TABLET | Freq: Once | ORAL | Status: DC | PRN

## 2015-08-13 MED ORDER — ONDANSETRON 4 MG PO TBDP
ORAL_TABLET | ORAL | Status: AC
  Filled 2015-08-13: qty 1

## 2015-08-13 MED ORDER — MORPHINE SULFATE (PF) 4 MG/ML IV SOLN
4.0000 mg | Freq: Once | INTRAVENOUS | Status: AC
  Administered 2015-08-13: 4 mg via INTRAVENOUS
  Filled 2015-08-13: qty 1

## 2015-08-13 MED ORDER — DIPHENHYDRAMINE HCL 50 MG/ML IJ SOLN
25.0000 mg | Freq: Once | INTRAMUSCULAR | Status: AC
Start: 2015-08-13 — End: 2015-08-13
  Administered 2015-08-13: 25 mg via INTRAVENOUS
  Filled 2015-08-13: qty 1

## 2015-08-13 MED ORDER — METOCLOPRAMIDE HCL 5 MG/ML IJ SOLN
10.0000 mg | Freq: Once | INTRAMUSCULAR | Status: AC
  Administered 2015-08-13: 10 mg via INTRAVENOUS
  Filled 2015-08-13: qty 2

## 2015-08-13 MED ORDER — PROMETHAZINE HCL 25 MG RE SUPP: 25.0000 mg | Freq: Four times a day (QID) | RECTAL | Status: DC | PRN

## 2015-08-13 MED ORDER — HYDROCODONE-ACETAMINOPHEN 5-325 MG PO TABS
1.0000 | ORAL_TABLET | Freq: Once | ORAL | Status: AC
  Administered 2015-08-13: 1 via ORAL
  Filled 2015-08-13: qty 1

## 2015-08-13 MED ORDER — ONDANSETRON 4 MG PO TBDP: 4.0000 mg | ORAL_TABLET | Freq: Three times a day (TID) | ORAL | Status: DC | PRN

## 2015-08-13 NOTE — Discharge Instructions (Signed)

## 2015-08-13 NOTE — ED Notes (Signed)
Pt family member states he vomited the ginger ale

## 2015-08-13 NOTE — ED Notes (Addendum)
Pt. Having n/v/d since last night.  Pt. Is alert and oriented X 4.  Pt. Is sweaty in triage.  Pt. Is very agitated and hyperventilating.  Pt. Attempted to lay on the floor, Redirected and assisted him in the chair.

## 2015-08-13 NOTE — ED Provider Notes (Signed)
CSN: [id number]     Arrival date & time 08/13/15  1044 History   First MD Initiated Contact with Patient 08/13/15 1123     Chief Complaint  Patient presents with  . Emesis     (Consider location/radiation/quality/duration/timing/severity/associated sxs/prior Treatment) HPI   Nathan Flores is a 35 year old male with history of sarcoidosis, current smoker, he presents the emergency department with nausea, vomiting and diarrhea associated with intermittent abdominal pain that began last night.  His girlfriend states that he has persistent and severe vomiting and diarrhea and that she cannot estimate the number of times.  He states that he has abdominal pain located up and down the middle of his abdomen just prior to having a bowel movement or vomiting and then it resolves.  Abdominal pain is described as cramping, intermittent, w/o radiation, rated 5/10.  He states he was unable to take his medication today. He states that he is on 10 mg of prednisone daily for his sarcoidosis, he has been on prednisone for many years.   He denies hematochezia, melena, hematemesis.  He denies any recent sick contacts.  History is very limited as pt is non-compliant with providing answers and allowing examination.    Past Medical History  Diagnosis Date  . Sarcoidosis ([place])   . Sarcoidosis [facility])    Past Surgical History  Procedure Laterality Date  . Lymph node biopsy     No family history on file. Social History  Substance Use Topics  . Smoking status: Current Every Day Smoker -- 0.50 packs/day for 2 years    Types: Cigarettes  . Smokeless tobacco: None  . Alcohol Use: 0.6 oz/week    1 Cans of beer per week     Comment: occasionally    Review of Systems  All other systems reviewed and are negative.     Allergies  Review of patient's allergies indicates no known allergies.  Home Medications   Prior to Admission medications   Medication Sig Start Date End Date Taking? Authorizing  Provider  predniSONE (DELTASONE) 20 MG tablet Take 1.5 tablets (30 mg total) by mouth daily. 06/08/14  Yes Kaitlyn Szekalski, PA-C  trimethoprim-polymyxin b (POLYTRIM) ophthalmic solution Place 1 drop into the left eye every 4 (four) hours. 08/19/14  Yes Kaitlyn Szekalski, PA-C  amoxicillin-clavulanate (AUGMENTIN) 875-125 MG per tablet Take 1 tablet by mouth every 12 (twelve) hours. Patient not taking: Reported on 08/13/2015 03/14/14   Antonietta Breach, PA-C  ondansetron (ZOFRAN ODT) 4 MG disintegrating tablet Take 1 tablet (4 mg total) by mouth every 8 (eight) hours as needed for nausea or vomiting. 08/13/15   Delsa Grana, PA-C  ondansetron (ZOFRAN) 4 MG tablet Take 1 tablet (4 mg total) by mouth every 6 (six) hours. Patient not taking: Reported on 08/13/2015 03/14/14   Antonietta Breach, PA-C  promethazine (PHENERGAN) 25 MG suppository Place 1 suppository (25 mg total) rectally every 6 (six) hours as needed for refractory nausea / vomiting. 08/13/15   Delsa Grana, PA-C  selenium sulfide (SELSUN) 1 % LOTN Apply 1 application topically daily. Apply to rash on back and chest Patient not taking: Reported on 08/13/2015 03/14/14   Antonietta Breach, PA-C   BP 111/77 mmHg  Pulse 83  Temp(Src) 99.4 F (37.4 C) (Oral)  Resp 16  Ht 6\' 1"  (1.854 m)  Wt 95.255 kg  BMI 27.71 kg/m2  SpO2 98% Physical Exam  Constitutional: He is oriented to person, place, and time. He appears well-developed and well-nourished. No distress.  HENT:  Head: Normocephalic and atraumatic.  Nose: Nose normal.  Mouth/Throat: No oropharyngeal exudate.  Oral mucosa dry, no cyanosis  Eyes: Conjunctivae and EOM are normal. Pupils are equal, round, and reactive to light. Right eye exhibits no discharge. Left eye exhibits no discharge. No scleral icterus.  Neck: Normal range of motion. No JVD present. No tracheal deviation present. No thyromegaly present.  Cardiovascular: Normal rate, regular rhythm, normal heart sounds, intact distal pulses and normal  pulses.  Exam reveals no gallop and no friction rub.   No murmur heard. Pulmonary/Chest: Effort normal and breath sounds normal. No accessory muscle usage. No respiratory distress. He has no decreased breath sounds. He has no wheezes. He has no rales. He exhibits no tenderness.  Abdominal: Soft. Normal appearance and bowel sounds are normal. He exhibits no distension and no mass. There is no tenderness. There is no rigidity, no rebound, no guarding, no CVA tenderness, no tenderness at McBurney's point and negative Murphy's sign.  Musculoskeletal: Normal range of motion. He exhibits no edema or tenderness.  Lymphadenopathy:    He has no cervical adenopathy.  Neurological: He is alert and oriented to person, place, and time. He has normal reflexes. He exhibits normal muscle tone. Coordination and gait normal.  Skin: Skin is warm and dry. No rash noted. He is not diaphoretic. No cyanosis or erythema. No pallor. Nails show no clubbing.  Skin warm and dry, no diaphoresis, rash with patches   Psychiatric: He has a normal mood and affect. He is agitated.  Nursing note and vitals reviewed.   ED Course  Procedures (including critical care time) Labs Review Labs Reviewed  COMPREHENSIVE METABOLIC PANEL - Abnormal; Notable for the following:    CO2 19 (*)    Glucose, Bld 134 (*)    Creatinine, Ser 1.41 (*)    Total Bilirubin 1.4 (*)    All other components within normal limits  CBC - Abnormal; Notable for the following:    Platelets 125 (*)    All other components within normal limits  URINALYSIS, ROUTINE W REFLEX MICROSCOPIC (NOT AT [facility]) - Abnormal; Notable for the following:    Hgb urine dipstick MODERATE (*)    Ketones, ur 15 (*)    All other components within normal limits  URINE MICROSCOPIC-ADD ON - Abnormal; Notable for the following:    Squamous Epithelial / LPF 0-5 (*)    All other components within normal limits  I-STAT VENOUS BLOOD GAS, ED - Abnormal; Notable for the following:     pH, Ven 7.578 (*)    pCO2, Ven 20.9 (*)    Bicarbonate 19.5 (*)    All other components within normal limits  LIPASE, BLOOD  BLOOD GAS, VENOUS    Imaging Review No results found. I have personally reviewed and evaluated these images and lab results as part of my medical decision-making.   EKG Interpretation None      MDM   Patient with multiple episodes of vomiting and diarrhea that began last night. Patient is unwilling to answer any questions and designated his "lady" as his "mouthpiece."  She states that it began last night that has been very uncomfortable. She denies any change in mental status.  The patient had a benign exam, no abdominal tenderness, lungs clear to auscultation, oral mucosa mildly dry. Orthostatics were negative, vital signs stable throughout his stay, pt afebrile,. He will appear very lethargic and sluggish to response when family members are in the room however when his family members leave  the exam room, he is able to follow directions promptly without difficulty. He is also able to stand without difficulty.  He was given 1.5 L of IV fluid, Reglan and Benadryl for nausea, morphine for pain, then Had one unsuccessful PO trial with emesis. He was then given Zofran and Solu-Medrol. He states that he did not take his prednisone today and he is on high-dose chronic prednisone for his sarcoid. He then was able to tolerate PO's.  Labs are significant for hematuria, alkalosis on ABG likely secondary to GI losses. Serum creatinine mildly elevated however not significantly elevated from recent labs.  Pt was able to eat and ambulate.  He was comfortable discharging home after he was able to tolerate ginger ale. Return precautions were reviewed with him. He was provided Zofran and Phenergan, he was instructed to return to the ER if he is unable to take all medications, specifically prednisone.  He was encouraged to sip on small amounts of fluids and progress diet slowly. He  was discharged in good condition, VSS.  Filed Vitals:   08/13/15 1430 08/13/15 1500 08/13/15 1515 08/13/15 1525  BP: 113/63 118/64 111/77   Pulse: 78 86 83   Temp:    99.4 F (37.4 C)  TempSrc:    Oral  Resp: 12 13 16    Height:      Weight:      SpO2: 100% 97% 98%      Final diagnoses:  Nausea vomiting and diarrhea        Delsa Grana, PA-C 08/15/15 0309  Malvin Johns, MD 08/20/15 [id number]

## 2015-08-13 NOTE — ED Notes (Signed)
Pt sats dropped to 79% after morphine. Instructed to take deep breaths, Sats 97% at this time

## 2015-08-13 NOTE — ED Notes (Signed)
Pt tolerating sips of ginger ale.

## 2016-04-10 ENCOUNTER — Ambulatory Visit (HOSPITAL_COMMUNITY)
Admission: EM | Admit: 2016-04-10 | Discharge: 2016-04-10 | Disposition: A | Discharge status: Home / Self Care | Payer: BLUE CROSS/BLUE SHIELD | Attending: Family Medicine | Admitting: Family Medicine

## 2016-04-10 ENCOUNTER — Encounter (HOSPITAL_COMMUNITY): Payer: Self-pay | Admitting: Emergency Medicine

## 2016-04-10 DIAGNOSIS — J0100 Acute maxillary sinusitis, unspecified: Secondary | ICD-10-CM

## 2016-04-10 DIAGNOSIS — B360 Pityriasis versicolor: Secondary | ICD-10-CM | POA: Diagnosis not present

## 2016-04-10 MED ORDER — FLUCONAZOLE 200 MG PO TABS: ORAL_TABLET | ORAL | 1 refills | Status: DC

## 2016-04-10 MED ORDER — AMOXICILLIN-POT CLAVULANATE 875-125 MG PO TABS: 1.0000 | ORAL_TABLET | Freq: Two times a day (BID) | ORAL | 0 refills | Status: DC

## 2016-04-10 NOTE — ED Provider Notes (Addendum)
[place]    CSN: [id number] Arrival date & time: 04/10/16  1055     History   Chief Complaint Chief Complaint  Patient presents with  . Facial Pain    HPI Nathan Flores is a 35 y.o. male.   This is a 35 year old man who presents for evaluation of symptoms consistent with an upper respiratory infection. He's been having purulent nasal discharge for over 10 days and has developed facial pain and pressure.  Patient also has a chronic skin rash. He took some skin wash from a dermatologist but has helped.  He works for [employer]. 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  Patient smokes and has a history of sarcoidosis. I asked patient to do his best to quit smoking.      Past Medical History:  Diagnosis Date  . Sarcoidosis ([place])   . Sarcoidosis ([place])     There are no active problems to display for this patient.   Past Surgical History:  Procedure Laterality Date  . LYMPH NODE BIOPSY         Home Medications    Prior to Admission medications   Medication Sig Start Date End Date Taking? Authorizing Provider  amoxicillin-clavulanate (AUGMENTIN) 875-125 MG tablet Take 1 tablet by mouth every 12 (twelve) hours. 04/10/16   Robyn Haber, MD  fluconazole (DIFLUCAN) 200 MG tablet Take both tablets at one time 04/10/16   Robyn Haber, MD  predniSONE (DELTASONE) 20 MG tablet Take 1.5 tablets (30 mg total) by mouth daily. 06/08/14   Alvina Chou, PA-C    Family History History reviewed. No pertinent family history.  Social History Social History  Substance Use Topics  . Smoking status: Current Every Day Smoker    Packs/day: 0.50    Years: 2.00    Types: Cigarettes  . Smokeless tobacco: Not on file  . Alcohol use 0.6 oz/week    1 Cans of beer per week     Comment: occasionally     Allergies   Patient has no known allergies.   Review of Systems Review of Systems  Constitutional: Negative.   HENT: Positive for facial  swelling, sinus pain and sinus pressure.   Respiratory: Positive for cough.   Cardiovascular: Negative.   Skin: Positive for rash.  Neurological: Negative.      Physical Exam Triage Vital Signs ED Triage Vitals [04/10/16 1222]  Enc Vitals Group     BP 129/60     Pulse Rate 69     Resp 14     Temp 98.1 F (36.7 C)     Temp Source Oral     SpO2 100 %     Weight      Height      Head Circumference      Peak Flow      Pain Score      Pain Loc      Pain Edu?      Excl. in [place]?    No data found.   Updated Vital Signs BP 129/60 (BP Location: Left Arm)   Pulse 69   Temp 98.1 F (36.7 C) (Oral)   Resp 14   SpO2 100%    Physical Exam  Constitutional: He appears well-developed and well-nourished.  HENT:  Head: Normocephalic and atraumatic.  Right Ear: External ear normal.  Left Ear: External ear normal.  Mouth/Throat: Oropharynx is clear and moist.  Mucopurulent nasal discharge  Eyes: Conjunctivae and EOM are normal.  Neck: Normal range  of motion. Neck supple.  Skin: Rash noted.  Patient has a verrucous skin rash over both extensor surfaces of his elbows.  He also has a rash of depigmentation and small and large confluent areas on his upper arms, torso, and back  Nursing note and vitals reviewed.    UC Treatments / Results  Labs (all labs ordered are listed, but only abnormal results are displayed) Labs Reviewed - No data to display  EKG  EKG Interpretation None       Radiology No results found.  Procedures Procedures (including critical care time)  Medications Ordered in UC Medications - No data to display   Initial Impression / Assessment and Plan / UC Course  I have reviewed the triage vital signs and the nursing notes.  Pertinent labs & imaging results that were available during my care of the patient were reviewed by me and considered in my medical decision making (see chart for details).  Clinical Course     Final Clinical  Impressions(s) / UC Diagnoses   Final diagnoses:  Acute maxillary sinusitis, recurrence not specified  Tinea versicolor    New Prescriptions New Prescriptions   AMOXICILLIN-CLAVULANATE (AUGMENTIN) 875-125 MG TABLET    Take 1 tablet by mouth every 12 (twelve) hours.   FLUCONAZOLE (DIFLUCAN) 200 MG TABLET    Take both tablets at one time     Robyn Haber, MD 04/10/16 1245    Robyn Haber, MD 04/10/16 1246    Robyn Haber, MD 04/10/16 1247    Robyn Haber, MD 04/10/16 1248

## 2016-04-10 NOTE — Discharge Instructions (Signed)
Expect the rash to resolve in one month

## 2016-04-10 NOTE — ED Triage Notes (Signed)
The patient presented to the [facility] with a complaint of sinus pain and pressure x 1 week. The patient reported that he is nasally congested and has a green productive drainage.

## 2016-07-08 ENCOUNTER — Ambulatory Visit (HOSPITAL_COMMUNITY)
Admission: EM | Admit: 2016-07-08 | Discharge: 2016-07-08 | Disposition: A | Discharge status: Home / Self Care | Payer: BLUE CROSS/BLUE SHIELD | Attending: Family Medicine | Admitting: Family Medicine

## 2016-07-08 DIAGNOSIS — J0100 Acute maxillary sinusitis, unspecified: Secondary | ICD-10-CM

## 2016-07-08 MED ORDER — IPRATROPIUM BROMIDE 0.06 % NA SOLN: 2.0000 | Freq: Four times a day (QID) | NASAL | 1 refills | Status: DC

## 2016-07-08 MED ORDER — DOXYCYCLINE HYCLATE 100 MG PO CAPS: 100.0000 mg | ORAL_CAPSULE | Freq: Two times a day (BID) | ORAL | 0 refills | Status: DC

## 2016-07-08 NOTE — ED Triage Notes (Signed)
C/o sinus infection States pressure in face States nasal congestion is green States fatigue

## 2016-07-08 NOTE — Discharge Instructions (Signed)
Drink plenty of fluids as discussed, use medicine as prescribed, and mucinex or delsym for cough. Return or see your doctor if further problems °

## 2016-07-08 NOTE — ED Provider Notes (Signed)
[facility]    CSN: [id number] Arrival date & time: 07/08/16  1416     History   Chief Complaint Chief Complaint  Patient presents with  . Facial Pain    HPI Nathan Flores is a 36 y.o. male.   The history is provided by the patient.  URI  Presenting symptoms: congestion, cough, facial pain, rhinorrhea and sore throat   Presenting symptoms: no fever   Severity:  Moderate Onset quality:  Gradual Duration:  1 week Progression:  Unchanged Chronicity:  New Relieved by:  None tried Worsened by:  Nothing Ineffective treatments:  None tried Associated symptoms: sinus pain     Past Medical History:  Diagnosis Date  . Sarcoidosis ([place])   . Sarcoidosis ([place])     There are no active problems to display for this patient.   Past Surgical History:  Procedure Laterality Date  . LYMPH NODE BIOPSY         Home Medications    Prior to Admission medications   Medication Sig Start Date End Date Taking? Authorizing Provider  predniSONE (DELTASONE) 20 MG tablet Take 1.5 tablets (30 mg total) by mouth daily. 06/08/14  Yes Kaitlyn Szekalski, PA-C  amoxicillin-clavulanate (AUGMENTIN) 875-125 MG tablet Take 1 tablet by mouth every 12 (twelve) hours. 04/10/16   Robyn Haber, MD  doxycycline (VIBRAMYCIN) 100 MG capsule Take 1 capsule (100 mg total) by mouth 2 (two) times daily. 07/08/16   Billy Fischer, MD  fluconazole (DIFLUCAN) 200 MG tablet Take both tablets at one time 04/10/16   Robyn Haber, MD  ipratropium (ATROVENT) 0.06 % nasal spray Place 2 sprays into both nostrils 4 (four) times daily. 07/08/16   Billy Fischer, MD    Family History No family history on file.  Social History Social History  Substance Use Topics  . Smoking status: Current Every Day Smoker    Packs/day: 0.50    Years: 2.00    Types: Cigarettes  . Smokeless tobacco: Not on file  . Alcohol use 0.6 oz/week    1 Cans of beer per week     Comment: occasionally     Allergies     Patient has no known allergies.   Review of Systems Review of Systems  Constitutional: Negative.  Negative for activity change, appetite change and fever.  HENT: Positive for congestion, facial swelling, rhinorrhea, sinus pain and sore throat.   Respiratory: Positive for cough.   Cardiovascular: Negative.   Gastrointestinal: Negative.   All other systems reviewed and are negative.    Physical Exam Triage Vital Signs ED Triage Vitals [07/08/16 1444]  Enc Vitals Group     BP 138/73     Pulse Rate 64     Resp 16     Temp 98.7 F (37.1 C)     Temp Source Oral     SpO2 97 %     Weight      Height      Head Circumference      Peak Flow      Pain Score      Pain Loc      Pain Edu?      Excl. in [place]?    No data found.   Updated Vital Signs BP 138/73 (BP Location: Left Arm)   Pulse 64   Temp 98.7 F (37.1 C) (Oral)   Resp 16   SpO2 97%   Visual Acuity Right Eye Distance:   Left Eye Distance:   Bilateral  Distance:    Right Eye Near:   Left Eye Near:    Bilateral Near:     Physical Exam  Constitutional: He is oriented to person, place, and time. He appears well-developed and well-nourished.  HENT:  Head: Normocephalic.  Right Ear: External ear normal.  Left Ear: External ear normal.  Nose: Nose normal.  Mouth/Throat: Oropharynx is clear and moist.  Eyes: Pupils are equal, round, and reactive to light.  Neck: Normal range of motion. Neck supple.  Cardiovascular: Normal rate, regular rhythm and normal heart sounds.   Pulmonary/Chest: Effort normal and breath sounds normal.  Lymphadenopathy:    He has no cervical adenopathy.  Neurological: He is alert and oriented to person, place, and time.  Skin: Skin is warm and dry.  Nursing note and vitals reviewed.    UC Treatments / Results  Labs (all labs ordered are listed, but only abnormal results are displayed) Labs Reviewed - No data to display  EKG  EKG Interpretation None       Radiology No  results found.  Procedures Procedures (including critical care time)  Medications Ordered in UC Medications - No data to display   Initial Impression / Assessment and Plan / UC Course  I have reviewed the triage vital signs and the nursing notes.  Pertinent labs & imaging results that were available during my care of the patient were reviewed by me and considered in my medical decision making (see chart for details).       Final Clinical Impressions(s) / UC Diagnoses   Final diagnoses:  Acute non-recurrent maxillary sinusitis    New Prescriptions New Prescriptions   DOXYCYCLINE (VIBRAMYCIN) 100 MG CAPSULE    Take 1 capsule (100 mg total) by mouth 2 (two) times daily.   IPRATROPIUM (ATROVENT) 0.06 % NASAL SPRAY    Place 2 sprays into both nostrils 4 (four) times daily.     Billy Fischer, MD 07/08/16 [phone]

## 2016-07-14 ENCOUNTER — Encounter (HOSPITAL_COMMUNITY): Payer: Self-pay | Admitting: Emergency Medicine

## 2016-07-14 ENCOUNTER — Ambulatory Visit (HOSPITAL_COMMUNITY)
Admission: EM | Admit: 2016-07-14 | Discharge: 2016-07-14 | Disposition: A | Discharge status: Home / Self Care | Payer: BLUE CROSS/BLUE SHIELD | Attending: Family Medicine | Admitting: Family Medicine

## 2016-07-14 DIAGNOSIS — J0101 Acute recurrent maxillary sinusitis: Secondary | ICD-10-CM | POA: Diagnosis not present

## 2016-07-14 MED ORDER — AMOXICILLIN-POT CLAVULANATE 875-125 MG PO TABS: 1.0000 | ORAL_TABLET | Freq: Two times a day (BID) | ORAL | 0 refills | Status: DC

## 2016-07-14 NOTE — ED Provider Notes (Signed)
[facility]    CSN: [id number] Arrival date & time: 07/14/16  1941     History   Chief Complaint Chief Complaint  Patient presents with  . Recurrent Sinusitis    HPI Nathan Flores is a 36 y.o. male.   Pt here for continued problems with a sinus infection.  He was seen here 6 days ago and he is not getting better on the doxycycline.  Pt states he normally takes amoxicillin and that works.      Past Medical History:  Diagnosis Date  . Sarcoidosis ([place])   . Sarcoidosis ([place])     There are no active problems to display for this patient.   Past Surgical History:  Procedure Laterality Date  . LYMPH NODE BIOPSY         Home Medications    Prior to Admission medications   Medication Sig Start Date End Date Taking? Authorizing Provider  amoxicillin-clavulanate (AUGMENTIN) 875-125 MG tablet Take 1 tablet by mouth every 12 (twelve) hours. 07/14/16   Robyn Haber, MD  ipratropium (ATROVENT) 0.06 % nasal spray Place 2 sprays into both nostrils 4 (four) times daily. 07/08/16   Billy Fischer, MD    Family History History reviewed. No pertinent family history.  Social History Social History  Substance Use Topics  . Smoking status: Current Every Day Smoker    Packs/day: 0.50    Years: 2.00    Types: Cigarettes  . Smokeless tobacco: Never Used  . Alcohol use 0.6 oz/week    1 Cans of beer per week     Comment: occasionally     Allergies   Patient has no known allergies.   Review of Systems Review of Systems  Constitutional: Negative.   HENT: Positive for congestion, facial swelling, sinus pain and sinus pressure.      Physical Exam Triage Vital Signs ED Triage Vitals  Enc Vitals Group     BP 07/14/16 1952 113/65     Pulse Rate 07/14/16 1952 68     Resp --      Temp 07/14/16 1952 98.3 F (36.8 C)     Temp Source 07/14/16 1952 Oral     SpO2 07/14/16 1952 98 %     Weight --      Height --      Head Circumference --      Peak Flow --       Pain Score 07/14/16 1951 7     Pain Loc --      Pain Edu? --      Excl. in [place]? --    No data found.   Updated Vital Signs BP 113/65 (BP Location: Right Arm)   Pulse 68   Temp 98.3 F (36.8 C) (Oral)   SpO2 98%    Physical Exam  Constitutional: He is oriented to person, place, and time. He appears well-developed and well-nourished.  HENT:  Head: Normocephalic.  Right Ear: External ear normal.  Left Ear: External ear normal.  Mouth/Throat: Oropharynx is clear and moist.  Swelling and mucopurulent debris in right nasal passage  Eyes: Conjunctivae are normal. Pupils are equal, round, and reactive to light.  Neck: Normal range of motion. Neck supple.  Pulmonary/Chest: Effort normal.  Musculoskeletal: Normal range of motion.  Neurological: He is alert and oriented to person, place, and time.  Skin: Skin is warm and dry.  Nursing note and vitals reviewed.    UC Treatments / Results  Labs (all labs ordered are  listed, but only abnormal results are displayed) Labs Reviewed - No data to display  EKG  EKG Interpretation None       Radiology No results found.  Procedures Procedures (including critical care time)  Medications Ordered in UC Medications - No data to display   Initial Impression / Assessment and Plan / UC Course  I have reviewed the triage vital signs and the nursing notes.  Pertinent labs & imaging results that were available during my care of the patient were reviewed by me and considered in my medical decision making (see chart for details).     Final Clinical Impressions(s) / UC Diagnoses   Final diagnoses:  Acute recurrent maxillary sinusitis    New Prescriptions New Prescriptions   AMOXICILLIN-CLAVULANATE (AUGMENTIN) 875-125 MG TABLET    Take 1 tablet by mouth every 12 (twelve) hours.     Robyn Haber, MD 07/14/16 2016

## 2016-07-14 NOTE — ED Triage Notes (Signed)
Pt here for continued problems with a sinus infection.  He was seen here 6 days ago and he is not getting better on the doxycycline.  Pt states he normally takes amoxicillin and that works.

## 2016-07-14 NOTE — Discharge Instructions (Signed)
Let me know if you are not improving by Tuesday

## 2017-02-14 ENCOUNTER — Emergency Department (HOSPITAL_COMMUNITY)
Admission: EM | Admit: 2017-02-14 | Discharge: 2017-02-14 | Disposition: A | Discharge status: Home / Self Care | Payer: BLUE CROSS/BLUE SHIELD | Attending: Emergency Medicine | Admitting: Emergency Medicine

## 2017-02-14 ENCOUNTER — Encounter (HOSPITAL_COMMUNITY): Payer: Self-pay | Admitting: Emergency Medicine

## 2017-02-14 ENCOUNTER — Emergency Department (HOSPITAL_COMMUNITY): Payer: BLUE CROSS/BLUE SHIELD

## 2017-02-14 DIAGNOSIS — Z79899 Other long term (current) drug therapy: Secondary | ICD-10-CM | POA: Insufficient documentation

## 2017-02-14 DIAGNOSIS — D869 Sarcoidosis, unspecified: Secondary | ICD-10-CM | POA: Insufficient documentation

## 2017-02-14 DIAGNOSIS — R29898 Other symptoms and signs involving the musculoskeletal system: Secondary | ICD-10-CM | POA: Diagnosis present

## 2017-02-14 DIAGNOSIS — F17210 Nicotine dependence, cigarettes, uncomplicated: Secondary | ICD-10-CM | POA: Diagnosis not present

## 2017-02-14 LAB — COMPREHENSIVE METABOLIC PANEL
ALBUMIN: 3.7 g/dL (ref 3.5–5.0)
ALK PHOS: 71 U/L (ref 38–126)
ALT: 27 U/L (ref 17–63)
AST: 38 U/L (ref 15–41)
Anion gap: 11 (ref 5–15)
BUN: 10 mg/dL (ref 6–20)
CALCIUM: 9.5 mg/dL (ref 8.9–10.3)
CHLORIDE: 101 mmol/L (ref 101–111)
CO2: 25 mmol/L (ref 22–32)
CREATININE: 1.28 mg/dL — AB (ref 0.61–1.24)
GFR calc non Af Amer: >60 mL/min (ref >60)
GLUCOSE: 129 mg/dL — AB (ref 65–99)
Potassium: 4 mmol/L (ref 3.5–5.1)
SODIUM: 137 mmol/L (ref 135–145)
Total Bilirubin: 0.7 mg/dL (ref 0.3–1.2)
Total Protein: 7.2 g/dL (ref 6.5–8.1)

## 2017-02-14 LAB — CBC WITH DIFFERENTIAL/PLATELET
Basophils Absolute: 0 10*3/uL (ref 0.0–0.1)
Basophils Relative: 0 %
EOS ABS: 0.3 10*3/uL (ref 0.0–0.7)
Eosinophils Relative: 9 %
HEMATOCRIT: 40.2 % (ref 39.0–52.0)
HEMOGLOBIN: 13.3 g/dL (ref 13.0–17.0)
LYMPHS ABS: 0.6 10*3/uL — AB (ref 0.7–4.0)
Lymphocytes Relative: 18 %
MCH: 26.5 pg (ref 26.0–34.0)
MCHC: 33.1 g/dL (ref 30.0–36.0)
MCV: 80.2 fL (ref 78.0–100.0)
MONO ABS: 0.5 10*3/uL (ref 0.1–1.0)
MONOS PCT: 14 %
NEUTROS ABS: 2 10*3/uL (ref 1.7–7.7)
Neutrophils Relative %: 59 %
Platelets: 132 10*3/uL — ABNORMAL LOW (ref 150–400)
RBC: 5.01 MIL/uL (ref 4.22–5.81)
RDW: 12.6 % (ref 11.5–15.5)
WBC: 3.3 10*3/uL — ABNORMAL LOW (ref 4.0–10.5)

## 2017-02-14 LAB — I-STAT CG4 LACTIC ACID, ED: LACTIC ACID, VENOUS: 1.76 mmol/L (ref 0.5–1.9)

## 2017-02-14 LAB — CK: Total CK: 374 U/L (ref 49–397)

## 2017-02-14 MED ORDER — SODIUM CHLORIDE 0.9 % IV BOLUS (SEPSIS): 1000.0000 mL | Freq: Once | INTRAVENOUS | Status: DC

## 2017-02-14 NOTE — ED Triage Notes (Signed)
Pt reports hx of sarcoidosis, was taken off of his long time prednisone 5-6 mos ago and started methotrexate about 6 weeks ago. Pt reports weakness in his extremities for the past 2 months. Pt has swollen lymph nodes in neck and chest. Denies fevers or other symptoms. Nad.

## 2017-02-14 NOTE — ED Provider Notes (Signed)
[facility] DEPT Provider Note   CSN: [id number] Arrival date & time: 02/14/17  1205     History   Chief Complaint Chief Complaint  Patient presents with  . Extremity Weakness    HPI Nathan Flores is a 36 y.o. male history sarcoidosis on methotrexate here presenting with weakness, leg pain, lymph node swelling. Patient states that over the last 2 months, he has progressively worsening bilateral proximal thigh pain. Patient states that it is worse after he sits and he has weakness and pain when he tries to get up. Patient also noticed diffuse lymph node swelling in his neck as well as his arms. He saw his doctor in [place] and was told that this is secondary to his sarcoidosis. He is compliant with his methotrexate and his rheumatologist is at [facility] and was taken off of prednisone since it was not helping. He denies any chest pain or shortness of breath or abdominal pain. He denies any trouble speaking or numbness.   The history is provided by the patient.    Past Medical History:  Diagnosis Date  . Sarcoidosis   . Sarcoidosis     There are no active problems to display for this patient.   Past Surgical History:  Procedure Laterality Date  . LYMPH NODE BIOPSY         Home Medications    Prior to Admission medications   Medication Sig Start Date End Date Taking? Authorizing Provider  amoxicillin-clavulanate (AUGMENTIN) 875-125 MG tablet Take 1 tablet by mouth every 12 (twelve) hours. 07/14/16   Robyn Haber, MD  ipratropium (ATROVENT) 0.06 % nasal spray Place 2 sprays into both nostrils 4 (four) times daily. 07/08/16   Billy Fischer, MD    Family History No family history on file.  Social History Social History  Substance Use Topics  . Smoking status: Current Every Day Smoker    Packs/day: 0.50    Years: 2.00    Types: Cigarettes  . Smokeless tobacco: Never Used  . Alcohol use 0.6 oz/week    1 Cans of beer per week     Comment: occasionally      Allergies   Patient has no known allergies.   Review of Systems Review of Systems  Musculoskeletal: Positive for extremity weakness.  Neurological: Positive for weakness.  All other systems reviewed and are negative.    Physical Exam Updated Vital Signs BP 107/84   Pulse 72   Temp 98.4 F (36.9 C) (Oral)   Resp 17   SpO2 99%   Physical Exam  Constitutional: He is oriented to person, place, and time. He appears well-developed.  HENT:  Head: Normocephalic.  Mouth/Throat: Oropharynx is clear and moist.  Eyes: Pupils are equal, round, and reactive to light. Conjunctivae and EOM are normal.  Neck: Normal range of motion. Neck supple.  Diffuse lymphadenopathy of the neck, nontender   Cardiovascular: Normal rate, regular rhythm and normal heart sounds.   Pulmonary/Chest: Effort normal and breath sounds normal. No respiratory distress. He has no rales.  Abdominal: Soft. Bowel sounds are normal. He exhibits no distension and no mass. There is no tenderness. There is no guarding.  Musculoskeletal: Normal range of motion.  Mild proximal thigh tenderness, no deformity. Diffuse lymphadenopathy L antecube, R axilla   Neurological: He is alert and oriented to person, place, and time.  CN 2- 12 intact. Nl strength throughout, nl finger to nose, nl sensation. Nl gait. Nl reflexes throughout   Skin: Skin is warm.  Psychiatric: He has a normal mood and affect.  Nursing note and vitals reviewed.    ED Treatments / Results  Labs (all labs ordered are listed, but only abnormal results are displayed) Labs Reviewed  COMPREHENSIVE METABOLIC PANEL - Abnormal; Notable for the following:       Result Value   Glucose, Bld 129 (*)    Creatinine, Ser 1.28 (*)    All other components within normal limits  CBC WITH DIFFERENTIAL/PLATELET - Abnormal; Notable for the following:    WBC 3.3 (*)    Platelets 132 (*)    Lymphs Abs 0.6 (*)    All other components within normal limits  CK   I-STAT CG4 LACTIC ACID, ED  I-STAT CG4 LACTIC ACID, ED    EKG  EKG Interpretation None       Radiology Dg Chest 2 View  Result Date: 02/14/2017 CLINICAL DATA:  One month of bilateral leg pain. History of thoracic lymphadenopathy. EXAM: CHEST  2 VIEW COMPARISON:  PA and lateral chest x-ray of July 27, 2011 FINDINGS: The lungs are adequately inflated. There is mildly increase conspicuity of known enlargedbilateral hilar lymph nodes. The heart and pulmonary vascularity are normal. There is no pleural effusion. The bony thorax exhibits no acute abnormality. IMPRESSION: Bilateral hilar lymphadenopathy slightly more conspicuous than on the previous study. No acute pulmonary parenchymal abnormality nor other acute cardiopulmonary disease. Electronically Signed   By: David  Martinique M.D.   On: 02/14/2017 12:57    Procedures Procedures (including critical care time)  Medications Ordered in ED Medications  sodium chloride 0.9 % bolus 1,000 mL (not administered)     Initial Impression / Assessment and Plan / ED Course  I have reviewed the triage vital signs and the nursing notes.  Pertinent labs & imaging results that were available during my care of the patient were reviewed by me and considered in my medical decision making (see chart for details).     Nathan Flores is a 36 y.o. male here with 2 months of proximal thigh pain and subjective weakness. Not progressive. Able to ambulate now. I have low suspicion for Guillaine Barre syndrome and I doubt stroke. I think likely from sarcoidosis vs mild rhabdo. Will get labs, CT head and neck w/wo contrast. Likely need rheumatology follow up.  4:05 PM CK nl. Cr stable. CXR showed some lymphadenopathy. Patient doesn't want to stay for CT. Wants second opinion with rheumatology at [facility]. Will send referral. May need PCP to refer.      Final Clinical Impressions(s) / ED Diagnoses   Final diagnoses:  None    New Prescriptions New  Prescriptions   No medications on file     Drenda Freeze, MD 02/14/17 1606

## 2017-02-14 NOTE — ED Notes (Signed)
Pt came to desk and spoke to this RN. Pt states "I am just going to go to [facility] since they rheumatology." Dr Darl Householder notified of pt's wishes and stated to try and keep the pt until CK results. CK added on by this RN. Pt agreed to this.

## 2017-02-14 NOTE — Discharge Instructions (Signed)
Continue methotrexate.   Call rheumatology office above at [facility] for appointment. Your PCP may need to fax over more paperwork  Return to ER if you have worse weakness, trouble walking, trouble speaking, chest pain, shortness of breath.

## 2017-12-14 ENCOUNTER — Other Ambulatory Visit: Payer: Self-pay

## 2017-12-14 ENCOUNTER — Ambulatory Visit (HOSPITAL_COMMUNITY)
Admission: EM | Admit: 2017-12-14 | Discharge: 2017-12-14 | Disposition: A | Discharge status: Home / Self Care | Payer: BLUE CROSS/BLUE SHIELD | Attending: Internal Medicine | Admitting: Internal Medicine

## 2017-12-14 ENCOUNTER — Encounter (HOSPITAL_COMMUNITY): Payer: Self-pay | Admitting: Emergency Medicine

## 2017-12-14 DIAGNOSIS — J0100 Acute maxillary sinusitis, unspecified: Secondary | ICD-10-CM | POA: Diagnosis not present

## 2017-12-14 MED ORDER — CEFDINIR 300 MG PO CAPS: 300.0000 mg | ORAL_CAPSULE | Freq: Two times a day (BID) | ORAL | 0 refills | Status: AC

## 2017-12-14 MED ORDER — IPRATROPIUM BROMIDE 0.06 % NA SOLN: 2.0000 | Freq: Four times a day (QID) | NASAL | 12 refills | Status: AC

## 2017-12-14 NOTE — ED Provider Notes (Signed)
[place]    CSN: [id number] Arrival date & time: 12/14/17  1056     History   Chief Complaint Chief Complaint  Patient presents with  . URI    HPI Nathan Flores is a 37 y.o. male.   Nathan Flores presents with complaints of nasal congestion, headache, chills at night and facial pressure which has been persistent for the past few weeks. Was started on course of amoxicillin but he stopped taking it as he felt it did not help his symptoms. Stopped taking approximately 1 week ago. Has not tried any other medications for his symptoms. No ear pain, no sore throat. He takes 15mg  of prednisone daily for sarcoidosis. Has not been taking his humira as he feels this may have contributed to his sinus symptoms. No specific known fevers. No gi/gu complaints.    ROS per HPI.      Past Medical History:  Diagnosis Date  . Sarcoidosis   . Sarcoidosis     There are no active problems to display for this patient.   Past Surgical History:  Procedure Laterality Date  . LYMPH NODE BIOPSY         Home Medications    Prior to Admission medications   Medication Sig Start Date End Date Taking? Authorizing Provider  Adalimumab (HUMIRA) 40 MG/0.8ML PSKT Inject into the skin.   Yes [provider]  predniSONE (DELTASONE) 20 MG tablet Take 15 mg by mouth daily with breakfast.   Yes [provider]  cefdinir (OMNICEF) 300 MG capsule Take 1 capsule (300 mg total) by mouth 2 (two) times daily for 10 days. 12/14/17 12/24/17  Augusto Gamble B, NP  ipratropium (ATROVENT) 0.06 % nasal spray Place 2 sprays into both nostrils 4 (four) times daily. 12/14/17   Zigmund Gottron, NP    Family History History reviewed. No pertinent family history.  Social History Social History   Tobacco Use  . Smoking status: Current Every Day Smoker    Packs/day: 0.50    Years: 2.00    Pack years: 1.00    Types: Cigarettes  . Smokeless tobacco: Never Used  Substance Use Topics  .  Alcohol use: Yes    Alcohol/week: 0.6 oz    Types: 1 Cans of beer per week    Comment: occasionally  . Drug use: No     Allergies   Patient has no known allergies.   Review of Systems Review of Systems   Physical Exam Triage Vital Signs ED Triage Vitals  Enc Vitals Group     BP 12/14/17 1131 118/80     Pulse Rate 12/14/17 1131 94     Resp 12/14/17 1131 16     Temp 12/14/17 1131 99.9 F (37.7 C)     Temp Source 12/14/17 1131 Oral     SpO2 12/14/17 1131 97 %     Weight --      Height --      Head Circumference --      Peak Flow --      Pain Score 12/14/17 1136 7     Pain Loc --      Pain Edu? --      Excl. in [place]? --    No data found.  Updated Vital Signs BP 118/80 (BP Location: Left Arm)   Pulse 94   Temp 99.9 F (37.7 C) (Oral)   Resp 16   SpO2 97%   Visual Acuity Right Eye Distance:   Left Eye  Distance:   Bilateral Distance:    Right Eye Near:   Left Eye Near:    Bilateral Near:     Physical Exam  Constitutional: He is oriented to person, place, and time. He appears well-developed and well-nourished.  HENT:  Head: Normocephalic and atraumatic.  Right Ear: Tympanic membrane, external ear and ear canal normal.  Left Ear: Tympanic membrane, external ear and ear canal normal.  Nose: Right sinus exhibits maxillary sinus tenderness. Right sinus exhibits no frontal sinus tenderness. Left sinus exhibits maxillary sinus tenderness. Left sinus exhibits no frontal sinus tenderness.  Mouth/Throat: Uvula is midline, oropharynx is clear and moist and mucous membranes are normal.  Eyes: Pupils are equal, round, and reactive to light. Conjunctivae are normal.  Neck: Normal range of motion.  Cardiovascular: Normal rate and regular rhythm.  Pulmonary/Chest: Effort normal and breath sounds normal.  Lymphadenopathy:    He has no cervical adenopathy.  Neurological: He is alert and oriented to person, place, and time.  Skin: Skin is warm and dry.  Vitals  reviewed.    UC Treatments / Results  Labs (all labs ordered are listed, but only abnormal results are displayed) Labs Reviewed - No data to display  EKG None  Radiology No results found.  Procedures Procedures (including critical care time)  Medications Ordered in UC Medications - No data to display  Initial Impression / Assessment and Plan / UC Course  I have reviewed the triage vital signs and the nursing notes.  Pertinent labs & imaging results that were available during my care of the patient were reviewed by me and considered in my medical decision making (see chart for details).     Persistent URI symptoms with facial pressure and headaches. On humira as well as prednisone chronically. Course of omnicef provided, encouraged to complete entire course. Nasal spray for symptoms. If symptoms worsen or do not improve in the next week to return to be seen or to follow up with PCP.  Patient verbalized understanding and agreeable to plan.    Final Clinical Impressions(s) / UC Diagnoses   Final diagnoses:  Acute maxillary sinusitis, recurrence not specified     Discharge Instructions     Push fluids to ensure adequate hydration and keep secretions thin.  Complete course of antibiotics.   Tylenol and/or ibuprofen as needed for pain or fevers.   Nasal spray 2-4 times a day to help with secretions.  If symptoms worsen or do not improve in the next week to return to be seen or to follow up with your primary care provider.    ED Prescriptions    Medication Sig Dispense Auth. Provider   ipratropium (ATROVENT) 0.06 % nasal spray Place 2 sprays into both nostrils 4 (four) times daily. 15 mL Augusto Gamble B, NP   cefdinir (OMNICEF) 300 MG capsule Take 1 capsule (300 mg total) by mouth 2 (two) times daily for 10 days. 20 capsule Zigmund Gottron, NP     Controlled Substance Prescriptions [place] Controlled Substance Registry consulted? Not Applicable   Zigmund Gottron,  NP 12/14/17 1207

## 2017-12-14 NOTE — Discharge Instructions (Signed)
Push fluids to ensure adequate hydration and keep secretions thin.  Complete course of antibiotics.   Tylenol and/or ibuprofen as needed for pain or fevers.   Nasal spray 2-4 times a day to help with secretions.  If symptoms worsen or do not improve in the next week to return to be seen or to follow up with your primary care provider.

## 2017-12-14 NOTE — ED Triage Notes (Signed)
Patient reports symptoms for a month.  Patient has head congestion, lightheaded, tired, chills.  Patient was seen and treated with amoxicillin, but says amoxicillin does not work for him

## 2017-12-23 ENCOUNTER — Encounter (HOSPITAL_COMMUNITY): Payer: Self-pay | Admitting: Emergency Medicine

## 2017-12-23 ENCOUNTER — Emergency Department (HOSPITAL_COMMUNITY)
Admission: EM | Admit: 2017-12-23 | Discharge: 2017-12-23 | Disposition: A | Discharge status: Home / Self Care | Payer: BLUE CROSS/BLUE SHIELD | Attending: Emergency Medicine | Admitting: Emergency Medicine

## 2017-12-23 ENCOUNTER — Other Ambulatory Visit: Payer: Self-pay

## 2017-12-23 DIAGNOSIS — R251 Tremor, unspecified: Secondary | ICD-10-CM | POA: Diagnosis present

## 2017-12-23 DIAGNOSIS — F17210 Nicotine dependence, cigarettes, uncomplicated: Secondary | ICD-10-CM | POA: Insufficient documentation

## 2017-12-23 DIAGNOSIS — R0981 Nasal congestion: Secondary | ICD-10-CM

## 2017-12-23 DIAGNOSIS — J3489 Other specified disorders of nose and nasal sinuses: Secondary | ICD-10-CM | POA: Diagnosis not present

## 2017-12-23 DIAGNOSIS — D861 Sarcoidosis of lymph nodes: Secondary | ICD-10-CM | POA: Insufficient documentation

## 2017-12-23 DIAGNOSIS — D869 Sarcoidosis, unspecified: Secondary | ICD-10-CM

## 2017-12-23 MED ORDER — GUAIFENESIN ER 600 MG PO TB12: 600.0000 mg | ORAL_TABLET | Freq: Two times a day (BID) | ORAL | 0 refills | Status: AC

## 2017-12-23 MED ORDER — PSEUDOEPHEDRINE HCL 30 MG PO TABS: 30.0000 mg | ORAL_TABLET | ORAL | 0 refills | Status: AC | PRN

## 2017-12-23 NOTE — ED Triage Notes (Signed)
Pt. went to UC last week and given antibiotic.

## 2017-12-23 NOTE — ED Provider Notes (Signed)
[facility] Emergency Department Provider Note MRN:  [id number]  Arrival date & time: 12/23/17     Chief Complaint   Shaking and Sarcoidosis   History of Present Illness   Nathan Flores is a 37 y.o. year-old male with a history of sarcoidosis presenting to the ED with chief complaint of sinus pressure.  Patient explains that he recently started Humira for sarcoidosis.  After the second dose, he began experiencing symptoms consistent with sinus infection.  His rheumatologist has given him prescriptions for antibiotics.  Has tried some decongestants without relief.  Denies fever.  Also endorsing tremors of the arms and legs for several weeks, scheduled for MRI next week.  Denies recent cough, no chest pain or shortness of breath, no abdominal pain.  Discomfort in the sinuses is mild to moderate in severity, constant.  Review of Systems  A complete 10 system review of systems was obtained and all systems are negative except as noted in the HPI and PMH.   Patient's Health History    Past Medical History:  Diagnosis Date  . Sarcoidosis   . Sarcoidosis     Past Surgical History:  Procedure Laterality Date  . LYMPH NODE BIOPSY      No family history on file.  Social History   Socioeconomic History  . Marital status: Divorced    Spouse name: Not on file  . Number of children: Not on file  . Years of education: Not on file  . Highest education level: Not on file  Occupational History  . Not on file  Social Needs  . Financial resource strain: Not on file  . Food insecurity:    Worry: Not on file    Inability: Not on file  . Transportation needs:    Medical: Not on file    Non-medical: Not on file  Tobacco Use  . Smoking status: Current Every Day Smoker    Packs/day: 0.50    Years: 2.00    Pack years: 1.00    Types: Cigarettes  . Smokeless tobacco: Never Used  Substance and Sexual Activity  . Alcohol use: Yes    Alcohol/week: 0.6 oz    Types: 1 Cans  of beer per week    Comment: occasionally  . Drug use: No  . Sexual activity: Yes    Birth control/protection: None  Lifestyle  . Physical activity:    Days per week: Not on file    Minutes per session: Not on file  . Stress: Not on file  Relationships  . Social connections:    Talks on phone: Not on file    Gets together: Not on file    Attends religious service: Not on file    Active member of club or organization: Not on file    Attends meetings of clubs or organizations: Not on file    Relationship status: Not on file  . Intimate partner violence:    Fear of current or ex partner: Not on file    Emotionally abused: Not on file    Physically abused: Not on file    Forced sexual activity: Not on file  Other Topics Concern  . Not on file  Social History Narrative  . Not on file     Physical Exam  Vital Signs and Nursing Notes reviewed Vitals:   12/23/17 0959 12/23/17 1257  BP: 126/84 (!) 156/79  Pulse: 93 89  Resp: 17 16  Temp: 99.2 F (37.3 C)   SpO2:  98% 97%    CONSTITUTIONAL: Well-appearing, NAD NEURO:  Alert and oriented x 3, no focal deficits, mild tremor with purposeful movements EYES:  eyes equal and reactive ENT/NECK:  no LAD, no JVD; tenderness to palpation to the frontal and maxillary sinuses CARDIO: Regular rate, well-perfused, normal S1 and S2 PULM:  CTAB no wheezing or rhonchi GI/GU:  normal bowel sounds, non-distended, non-tender MSK/SPINE:  No gross deformities, no edema SKIN:  no rash, atraumatic PSYCH:  Appropriate speech and behavior  Diagnostic and Interventional Summary    EKG Interpretation  Date/Time:    Ventricular Rate:    PR Interval:    QRS Duration:   QT Interval:    QTC Calculation:   R Axis:     Text Interpretation:        Labs Reviewed - No data to display  No orders to display    Medications - No data to display   Procedures Critical Care  ED Course and Medical Decision Making  I have reviewed the triage vital  signs and the nursing notes.  Pertinent labs & imaging results that were available during my care of the patient were reviewed by me and considered in my medical decision making (see below for details).    37 year old male presenting with continued sinus pressure for several weeks, also with several weeks of tremor with purposeful movements.  Being followed by neurology, has outpatient MRI scheduled.  Vital signs stable, well-appearing, no focal neurological deficits, does exhibit tremor on exam.  Favoring essential tremor, patient concerned about ALS due to his Internet research.  Explained there is test for ALS in the emergency department.  Patient will follow-up with his neurologist.  No continued fever, has recently completed antibiotics for his sinusitis.  I think his pain is mostly related to continued congestion of the sinuses with copious mucus, but there is no evidence of continued active infection.  Encouraged continuation of nasal decongestants, Tylenol, ibuprofen, and he will follow-up with his regular doctors and neurologist.  After the discussed management above, the patient was determined to be safe for discharge.  The patient was in agreement with this plan and all questions regarding their care were answered.  ED return precautions were discussed and the patient will return to the ED with any significant worsening of condition.  [name], MD [place] [email or web address]  Final Clinical Impressions(s) / ED Diagnoses     ICD-10-CM   1. Sinus congestion R09.81   2. Tremor R25.1   3. Sarcoidosis D86.9     ED Discharge Orders        Ordered    guaiFENesin (MUCINEX) 600 MG 12 hr tablet  2 times daily     12/23/17 1254    pseudoephedrine (SUDAFED) 30 MG tablet  Every 4 hours PRN     12/23/17 1254         Maudie Flakes, MD 12/23/17 2228

## 2017-12-23 NOTE — ED Notes (Signed)
Patient verbalizes understanding of discharge instructions. Opportunity for questioning and answers were provided. Armband removed by staff, pt discharged from ED ambulatory.   

## 2017-12-23 NOTE — ED Notes (Signed)
ED Provider at bedside. 

## 2017-12-23 NOTE — ED Notes (Signed)
Patient ambulatory to bathroom with steady gait at this time 

## 2017-12-23 NOTE — Discharge Instructions (Signed)
You were evaluated in the Emergency Department and after careful evaluation, we did not find any emergent condition requiring admission or further testing in the hospital.  Your symptoms today seem to be due to sinus congestion.  Please use medications provided and follow-up with your regular doctors.  Please return to the Emergency Department if you experience any worsening of your condition.  We encourage you to follow up with a primary care provider.  Thank you for allowing [place] to be a part of your care.

## 2017-12-23 NOTE — ED Triage Notes (Signed)
Pt. Stated, I have sarcoidosis and Ive had a sinus infection and they gave me an antibiotic , and I usually taker Prednisone for the sarcoidosis. I go to the 'Dr.. In [place] but they don't really know.

## 2018-11-22 ENCOUNTER — Ambulatory Visit (HOSPITAL_COMMUNITY)
Admission: EM | Admit: 2018-11-22 | Discharge: 2018-11-22 | Disposition: A | Discharge status: Home / Self Care | Payer: BC Managed Care – PPO | Attending: Family Medicine | Admitting: Family Medicine

## 2018-11-22 ENCOUNTER — Other Ambulatory Visit: Payer: Self-pay

## 2018-11-22 ENCOUNTER — Encounter (HOSPITAL_COMMUNITY): Payer: Self-pay

## 2018-11-22 DIAGNOSIS — J320 Chronic maxillary sinusitis: Secondary | ICD-10-CM | POA: Diagnosis not present

## 2018-11-22 MED ORDER — AMOXICILLIN-POT CLAVULANATE 875-125 MG PO TABS: 1.0000 | ORAL_TABLET | Freq: Two times a day (BID) | ORAL | 0 refills | Status: AC

## 2018-11-22 NOTE — ED Triage Notes (Signed)
Patient presents to Urgent Care with complaints of runny nose and sinus headache since 3 days ago. Patient reports he has been taking otc meds with minimal improvement, pt denies sore throat, cough, or fever.

## 2018-11-22 NOTE — Discharge Instructions (Addendum)
Do not start the antibiotic Augmentin unless your symptoms persist for more than 7 days or if they worsen.  Take over-the-counter Mucinex and ibuprofen to relieve the pain and pressure.  Follow-up as scheduled with your primary care provider.  Return here if you develop fever, chills, sore throat, ear pain, nausea, vomiting, diarrhea, cough, shortness of breath.

## 2018-11-22 NOTE — ED Provider Notes (Signed)
[place]    CSN: [id number] Arrival date & time: 11/22/18  1002     History   Chief Complaint Chief Complaint  Patient presents with  . Nasal Congestion    HPI Nathan Flores is a 38 y.o. male.   Patient presents today with 3-day history of runny nose and sinus pressure and sinus headache.  He denies sore throat, ear pain, fever, chills, vomiting, diarrhea, cough, shortness of breath, chest pain.  He states he has taken over-the-counter Mucinex and Sudafed without relief.  He has a history of frequent sinus infections and sarcoidosis.  The history is provided by the patient.    Past Medical History:  Diagnosis Date  . Sarcoidosis   . Sarcoidosis     There are no active problems to display for this patient.   Past Surgical History:  Procedure Laterality Date  . LYMPH NODE BIOPSY         Home Medications    Prior to Admission medications   Medication Sig Start Date End Date Taking? Authorizing Provider  guaiFENesin (MUCINEX) 600 MG 12 hr tablet Take 1 tablet (600 mg total) by mouth 2 (two) times daily. 12/23/17  Yes Maudie Flakes, MD  amoxicillin-clavulanate (AUGMENTIN) 875-125 MG tablet Take 1 tablet by mouth every 12 (twelve) hours. 11/22/18   Sharion Balloon, NP  Cholecalciferol (VITAMIN D3) 2000 units capsule Take 2,000 Units by mouth daily.    [provider]  ipratropium (ATROVENT) 0.06 % nasal spray Place 2 sprays into both nostrils 4 (four) times daily. 12/14/17   Zigmund Gottron, NP  ondansetron (ZOFRAN-ODT) 4 MG disintegrating tablet Take 4 mg by mouth every 8 (eight) hours as needed for nausea/vomiting. 11/30/17   [provider]  predniSONE (DELTASONE) 20 MG tablet Take 15 mg by mouth daily with breakfast.    [provider]  pseudoephedrine (SUDAFED) 30 MG tablet Take 30 mg by mouth every 4 (four) hours as needed for congestion.    [provider]  pseudoephedrine (SUDAFED) 30 MG tablet Take 1 tablet (30 mg  total) by mouth every 4 (four) hours as needed for congestion. 12/23/17   Maudie Flakes, MD    Family History Family History  Problem Relation Age of Onset  . Healthy Mother   . Healthy Father     Social History Social History   Tobacco Use  . Smoking status: Former Smoker    Packs/day: 0.50    Years: 2.00    Pack years: 1.00    Types: Cigarettes    Quit date: 08/22/2017    Years since quitting: 1.2  . Smokeless tobacco: Never Used  Substance Use Topics  . Alcohol use: Yes    Alcohol/week: 1.0 standard drinks    Types: 1 Cans of beer per week    Comment: occasionally  . Drug use: No     Allergies   Patient has no known allergies.   Review of Systems Review of Systems  Constitutional: Negative for chills and fever.  HENT: Positive for rhinorrhea, sinus pressure and sinus pain. Negative for ear pain and sore throat.   Eyes: Negative for pain and visual disturbance.  Respiratory: Negative for cough and shortness of breath.   Cardiovascular: Negative for chest pain and palpitations.  Gastrointestinal: Negative for abdominal pain and vomiting.  Genitourinary: Negative for dysuria and hematuria.  Musculoskeletal: Negative for arthralgias and back pain.  Skin: Negative for color change and rash.  Neurological: Negative for seizures and  syncope.  All other systems reviewed and are negative.    Physical Exam Triage Vital Signs ED Triage Vitals  Enc Vitals Group     BP      Pulse      Resp      Temp      Temp src      SpO2      Weight      Height      Head Circumference      Peak Flow      Pain Score      Pain Loc      Pain Edu?      Excl. in [place]?    No data found.  Updated Vital Signs BP 120/73 (BP Location: Left Arm)   Pulse 82   Temp 98.5 F (36.9 C) (Oral)   Resp 17   SpO2 99%   Visual Acuity Right Eye Distance:   Left Eye Distance:   Bilateral Distance:    Right Eye Near:   Left Eye Near:    Bilateral Near:     Physical Exam Vitals  signs and nursing note reviewed.  Constitutional:      Appearance: He is well-developed.  HENT:     Head: Normocephalic and atraumatic.     Right Ear: Tympanic membrane normal.     Left Ear: Tympanic membrane normal.     Nose: Nose normal.     Comments: Right frontal and maxillary sinus tenderness.    Mouth/Throat:     Mouth: Mucous membranes are moist.     Pharynx: No oropharyngeal exudate or posterior oropharyngeal erythema.  Eyes:     Conjunctiva/sclera: Conjunctivae normal.  Neck:     Musculoskeletal: Neck supple.  Cardiovascular:     Rate and Rhythm: Normal rate and regular rhythm.  Pulmonary:     Effort: Pulmonary effort is normal. No respiratory distress.     Breath sounds: Normal breath sounds.  Abdominal:     Palpations: Abdomen is soft.     Tenderness: There is no abdominal tenderness.  Lymphadenopathy:     Cervical: No cervical adenopathy.  Skin:    General: Skin is warm and dry.  Neurological:     Mental Status: He is alert.      UC Treatments / Results  Labs (all labs ordered are listed, but only abnormal results are displayed) Labs Reviewed - No data to display  EKG   Radiology No results found.  Procedures Procedures (including critical care time)  Medications Ordered in UC Medications - No data to display  Initial Impression / Assessment and Plan / UC Course  I have reviewed the triage vital signs and the nursing notes.  Pertinent labs & imaging results that were available during my care of the patient were reviewed by me and considered in my medical decision making (see chart for details).   Chronic sinusitis.  Treating today with Augmentin if symptoms persist; may start on 11/25/2018.  Instructed patient to take ibuprofen and Mucinex for sinus discomfort and pressure.  Follow-up as scheduled with primary care provider.  Return here if he develops fever, chills, sore throat, ear pain, nausea, vomiting, diarrhea, cough, shortness of breath.    Final Clinical Impressions(s) / UC Diagnoses   Final diagnoses:  Chronic maxillary sinusitis     Discharge Instructions     Do not start the antibiotic Augmentin unless your symptoms persist for more than 7 days or if they worsen.  Take over-the-counter Mucinex and ibuprofen  to relieve the pain and pressure.  Follow-up as scheduled with your primary care provider.  Return here if you develop fever, chills, sore throat, ear pain, nausea, vomiting, diarrhea, cough, shortness of breath.    ED Prescriptions    Medication Sig Dispense Auth. Provider   amoxicillin-clavulanate (AUGMENTIN) 875-125 MG tablet Take 1 tablet by mouth every 12 (twelve) hours. 14 tablet Sharion Balloon, NP     Controlled Substance Prescriptions [place] Controlled Substance Registry consulted? Not Applicable   Sharion Balloon, NP 11/22/18 1116

## 2021-01-05 ENCOUNTER — Ambulatory Visit (INDEPENDENT_AMBULATORY_CARE_PROVIDER_SITE_OTHER): Payer: BC Managed Care – PPO

## 2021-01-05 ENCOUNTER — Ambulatory Visit (INDEPENDENT_AMBULATORY_CARE_PROVIDER_SITE_OTHER): Payer: BC Managed Care – PPO | Admitting: Podiatry

## 2021-01-05 ENCOUNTER — Encounter: Payer: Self-pay | Admitting: Podiatry

## 2021-01-05 ENCOUNTER — Other Ambulatory Visit: Payer: Self-pay

## 2021-01-05 DIAGNOSIS — M7672 Peroneal tendinitis, left leg: Secondary | ICD-10-CM | POA: Diagnosis not present

## 2021-01-05 DIAGNOSIS — M722 Plantar fascial fibromatosis: Secondary | ICD-10-CM | POA: Diagnosis not present

## 2021-01-05 MED ORDER — TRIAMCINOLONE ACETONIDE 10 MG/ML IJ SUSP
30.0000 mg | Freq: Once | INTRAMUSCULAR | Status: AC
Start: 2021-01-05 — End: 2021-01-05
  Administered 2021-01-05: 30 mg

## 2021-01-05 NOTE — Progress Notes (Signed)
Subjective:   Patient ID: Nathan Flores, male   DOB: 40 y.o.   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  Patient does not smoke and would like to be more active but has not been able to   Review of Systems  All other systems reviewed and are negative.      Objective:  Physical Exam Vitals and nursing note reviewed.  Constitutional:      Appearance: He is well-developed.  Pulmonary:     Effort: Pulmonary effort is normal.  Musculoskeletal:        General: Normal range of motion.  Skin:    General: Skin is warm.  Neurological:     Mental Status: He is alert.    Neurovascular status intact muscle strength found to be adequate range of motion within normal limits.  Patient is found to have exquisite discomfort in the medial band of the fascia at insertion of bilateral right and left and has inflammation at the peroneal insertion left along with keratotic lesion at the base of the fifth metatarsal.  Patient has good digital perfusion well oriented x3     Assessment:  Acute Planter fasciitis bilateral and peroneal tendinitis left which may be compensatory with patient having sarcoidosis which could be complicating factor     Plan:  H&P reviewed conditions and x-rays and today I did sterile prep and injected the plantar fascia right and left 3 mg Kenalog 5 mg Xylocaine and injected the fifth metatarsal base left 3 mg Kenalog 5 g Xylocaine advised on support shoes and discussed possible orthotics for future.  Reappoint to recheck 3 weeks or earlier if needed  X-rays indicate minimal spur no indication stress fracture moderate depression of the arch bilateral

## 2021-01-05 NOTE — Patient Instructions (Signed)

## 2021-02-02 ENCOUNTER — Ambulatory Visit (INDEPENDENT_AMBULATORY_CARE_PROVIDER_SITE_OTHER): Payer: BC Managed Care – PPO | Admitting: Podiatry

## 2021-02-02 ENCOUNTER — Encounter: Payer: Self-pay | Admitting: Podiatry

## 2021-02-02 ENCOUNTER — Other Ambulatory Visit: Payer: Self-pay

## 2021-02-02 DIAGNOSIS — M722 Plantar fascial fibromatosis: Secondary | ICD-10-CM

## 2021-02-02 MED ORDER — DICLOFENAC SODIUM 75 MG PO TBEC: 75.0000 mg | DELAYED_RELEASE_TABLET | Freq: Two times a day (BID) | ORAL | 2 refills | Status: AC

## 2021-02-02 MED ORDER — TRIAMCINOLONE ACETONIDE 10 MG/ML IJ SUSP
10.0000 mg | Freq: Once | INTRAMUSCULAR | Status: AC
  Administered 2021-02-02: 10 mg

## 2021-02-03 NOTE — Progress Notes (Signed)
Subjective:   Patient ID: Nathan Flores, male   DOB: 40 y.o.   MRN: [id number]   HPI Patient states he is improved but he still has a spot on the left heel that is been bothering him   ROS      Objective:  Physical Exam  Neurovascular status intact with continued acute discomfort left plantar fascial with moderate flatfoot deformity and patient who works on cement floors     Assessment:  Acute Planter fasciitis left over right improved but still present     Plan:  Advised patient on long-term orthotics and casted for orthotics to try to reduce plantar stresses on his feet and did do sterile prep today and injected the plantar fascial 3 mg Kenalog 5 mg Xylocaine and reappoint to recheck

## 2021-03-08 ENCOUNTER — Encounter: Payer: Self-pay | Admitting: Podiatry

## 2021-03-15 ENCOUNTER — Telehealth: Payer: Self-pay | Admitting: Podiatry

## 2021-03-15 NOTE — Telephone Encounter (Signed)
Pt left message asking to schedule an appt for an afternoon to pick up orthotics.  I returned call and it went to voicemail and it is full.

## 2021-07-06 ENCOUNTER — Other Ambulatory Visit: Payer: Self-pay

## 2021-07-06 ENCOUNTER — Ambulatory Visit: Payer: BC Managed Care – PPO

## 2021-07-06 DIAGNOSIS — M722 Plantar fascial fibromatosis: Secondary | ICD-10-CM

## 2021-07-06 NOTE — Progress Notes (Signed)
SITUATION: Reason for Visit: Fitting and Delivery of Custom Fabricated Foot Orthoses Patient Report: Patient reports comfort and is satisfied with device.  OBJECTIVE DATA: Patient History / Diagnosis:     ICD-10-CM   1. Plantar fasciitis  M72.2       Provided Device:  Custom Functional Foot Orthotics  GOAL OF ORTHOSIS - Improve gait - Decrease energy expenditure - Improve Balance - Provide Triplanar stability of foot complex - Facilitate motion  ACTIONS PERFORMED Patient was fit with foot orthotics trimmed to shoe last. Patient tolerated fittign procedure.   Patient was provided with verbal and written instruction and demonstration regarding donning, doffing, wear, care, proper fit, function, purpose, cleaning, and use of the orthosis and in all related precautions and risks and benefits regarding the orthosis.  Patient was also provided with verbal instruction regarding how to report any failures or malfunctions of the orthosis and necessary follow up care. Patient was also instructed to contact our office regarding any change in status that may affect the function of the orthosis.  Patient demonstrated independence with proper donning, doffing, and fit and verbalized understanding of all instructions.  PLAN: Patient is to follow up in one week or as necessary (PRN). All questions were answered and concerns addressed. Plan of care was discussed with and agreed upon by the patient.

## 2021-09-18 ENCOUNTER — Ambulatory Visit: Payer: BC Managed Care – PPO | Admitting: Podiatry

## 2021-09-18 ENCOUNTER — Encounter: Payer: Self-pay | Admitting: Podiatry

## 2021-09-18 DIAGNOSIS — M7672 Peroneal tendinitis, left leg: Secondary | ICD-10-CM

## 2021-09-18 DIAGNOSIS — M7671 Peroneal tendinitis, right leg: Secondary | ICD-10-CM

## 2021-09-18 MED ORDER — TRIAMCINOLONE ACETONIDE 10 MG/ML IJ SUSP
10.0000 mg | Freq: Once | INTRAMUSCULAR | Status: AC
  Administered 2021-09-18: 10 mg

## 2021-09-20 NOTE — Progress Notes (Signed)
Subjective:  ? ?Patient ID: Nathan Flores, male   DOB: 41 y.o.   MRN: [id number]  ? ?HPI ?Patient presents stating he has a lot more pain now in his outside of his right foot with lesion with the left doing okay but gets occasionally sore.  States it makes it hard for him to walk and especially if he bumps the area  ? ? ?ROS ? ? ?   ?Objective:  ?Physical Exam  ?Neurovascular status intact muscle strength found to be adequate range of motion adequate inflammation pain at the peroneal base right over left foot with inflammation and some keratotic tissue with prominence of the area. ? ?   ?Assessment:  ?Inflammatory condition with peroneal tendinitis along with enlargement around the base of the fifth metatarsal with fluid buildup ? ?   ?Plan:  ?H&P explained condition and his structural walking condition and that we cannot really change that.  We are continuing to work on this conservatively and it is working for him so far and today I did sterile prep and injected the base fifth metatarsal after explaining chances for rupture and risk with 3 mg dexamethasone Kenalog 5 mg Xylocaine debrided lesion.  Ultimately may require something more aggressive but hopefully this will continue to work ?   ? ? ?

## 2021-11-29 ENCOUNTER — Ambulatory Visit: Payer: BC Managed Care – PPO | Admitting: Podiatry

## 2021-11-29 DIAGNOSIS — L84 Corns and callosities: Secondary | ICD-10-CM | POA: Diagnosis not present

## 2021-11-29 DIAGNOSIS — M7672 Peroneal tendinitis, left leg: Secondary | ICD-10-CM

## 2021-11-29 DIAGNOSIS — D169 Benign neoplasm of bone and articular cartilage, unspecified: Secondary | ICD-10-CM

## 2021-11-30 NOTE — Progress Notes (Signed)
Subjective:   Patient ID: Nathan Flores, male   DOB: 41 y.o.   MRN: [id number]   HPI Patient states he still getting a lot of pain in the outside of his feet and he wants to know is there anything else that we can do to try to alleviate this including surgery   ROS      Objective:  Physical Exam  Neurovascular status found to be intact with patient found to have enlargement around the fifth metatarsal base bilateral and he develops petit lesions associated with this but more the trauma of the structural bone and the position of his feet in the general gait process      Assessment:  Difficult processes I do think that this is related to his overall walking structure and that we cannot change that completely with lesions but also for     Plan:  Reviewed at great length his overall foot structure leg structure and the consideration for shaving bone on the side with even that I am not confident will solve his problem.  Patient will have to use shoe gear modifications cushioning to the best of his ability I debrided lesions courtesy today and I discussed again at great length the consideration surgically but I do not recommend this unless we are at a point we have no choice.  Patient will be seen back to recheck depending on response
